# Patient Record
Sex: Female | Born: 1990 | Race: White | Hispanic: Yes | Marital: Single | Smoking: Never smoker
Health system: Southern US, Community
[De-identification: ages and names within clinical notes are randomized; demographics above are authoritative.]

## PROBLEM LIST (undated history)

## (undated) DIAGNOSIS — T7840XA Allergy, unspecified, initial encounter: Secondary | ICD-10-CM

## (undated) DIAGNOSIS — F419 Anxiety disorder, unspecified: Secondary | ICD-10-CM

## (undated) DIAGNOSIS — G43909 Migraine, unspecified, not intractable, without status migrainosus: Secondary | ICD-10-CM

## (undated) DIAGNOSIS — F319 Bipolar disorder, unspecified: Secondary | ICD-10-CM

## (undated) DIAGNOSIS — F32A Depression, unspecified: Secondary | ICD-10-CM

## (undated) DIAGNOSIS — F329 Major depressive disorder, single episode, unspecified: Secondary | ICD-10-CM

## (undated) HISTORY — DX: Major depressive disorder, single episode, unspecified: F32.9

## (undated) HISTORY — DX: Migraine, unspecified, not intractable, without status migrainosus: G43.909

## (undated) HISTORY — DX: Bipolar disorder, unspecified: F31.9

## (undated) HISTORY — DX: Allergy, unspecified, initial encounter: T78.40XA

## (undated) HISTORY — DX: Anxiety disorder, unspecified: F41.9

## (undated) HISTORY — DX: Depression, unspecified: F32.A

---
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
## 2012-06-10 ENCOUNTER — Other Ambulatory Visit: Payer: Self-pay | Admitting: Physician Assistant

## 2012-06-11 ENCOUNTER — Other Ambulatory Visit: Payer: Self-pay | Admitting: Physician Assistant

## 2012-06-14 NOTE — Telephone Encounter (Signed)
Was just refilled???  Sent again to pharmacy

## 2012-07-29 ENCOUNTER — Other Ambulatory Visit: Payer: Self-pay | Admitting: Physician Assistant

## 2012-07-29 NOTE — Telephone Encounter (Signed)
Medication refilled per protocol. 

## 2012-09-20 ENCOUNTER — Encounter: Payer: Self-pay | Admitting: Physician Assistant

## 2012-09-20 ENCOUNTER — Ambulatory Visit (INDEPENDENT_AMBULATORY_CARE_PROVIDER_SITE_OTHER): Payer: BC Managed Care – PPO | Admitting: Physician Assistant

## 2012-09-20 VITALS — BP 110/70 | HR 72 | Temp 98.1°F | Resp 18 | Ht 62.5 in | Wt 121.0 lb

## 2012-09-20 DIAGNOSIS — F411 Generalized anxiety disorder: Secondary | ICD-10-CM

## 2012-09-20 DIAGNOSIS — F329 Major depressive disorder, single episode, unspecified: Secondary | ICD-10-CM

## 2012-09-20 DIAGNOSIS — F419 Anxiety disorder, unspecified: Secondary | ICD-10-CM

## 2012-09-20 DIAGNOSIS — T7840XA Allergy, unspecified, initial encounter: Secondary | ICD-10-CM | POA: Insufficient documentation

## 2012-09-20 DIAGNOSIS — F319 Bipolar disorder, unspecified: Secondary | ICD-10-CM

## 2012-09-20 DIAGNOSIS — M542 Cervicalgia: Secondary | ICD-10-CM

## 2012-09-20 DIAGNOSIS — G43909 Migraine, unspecified, not intractable, without status migrainosus: Secondary | ICD-10-CM

## 2012-09-20 DIAGNOSIS — T7840XS Allergy, unspecified, sequela: Secondary | ICD-10-CM

## 2012-09-20 DIAGNOSIS — F32A Depression, unspecified: Secondary | ICD-10-CM

## 2012-09-21 NOTE — Progress Notes (Signed)
Patient ID: Shadaya Marschner MRN: [id number], DOB: 1990-12-29, 22 y.o. Date of Encounter: @DATE @  Chief Complaint:  Chief Complaint  Patient presents with  . neck pain    few years  getting worse alot of popping and also having more headaches    HPI: 22 y.o. year old white female  presents with her mom for OV today.   She sees Psychiatry -dx with Bipolar- Manic/Depressive, and Anxiety- per pt and mom. She is on Pristiq, and has been on this for 3 years. This is the only psych med she is on. Says her mood has been stable and well controlled with this med.   She also has h/o multiple allergies.  These are her only PMH other than the 2 issues today:  1- Migraines: She has gone to U/C secondary to Migraines 7 times over past 8 months. Says the symptoms are very severe- has pain behind eyes, photophobia, nausea, and vomiting. Has been prescribed Zofran and Imitrex Nasal Spray-has to use the nasal spray b/c she vomits so much cannot use oral med. This does work for her.  However, mom concerned and wants to know why these are occurring. There is no family h/o migraines. As wel,, pt sees no pattern--migraines do not seem to occur with specific stress, menses, or certain foods. No known triggers. She has frequent migraines that are abated with meds --and has had to go to u/c 7 times as well.    2- Pain in back of neck, over the spine area. Mom says that the area sometimes appears swollen. Pt reports popping sound and a sensation that "something is pressing there." at times.    Past Medical History  Diagnosis Date  . Bipolar disorder   . Anxiety   . Depression   . Allergy      Home Meds: See attached medication section for current medication list. Any medications entered into computer today will not appear on this note's list. The medications listed below were entered prior to today. Current Outpatient Prescriptions on File Prior to Visit  Medication Sig Dispense Refill  . EPIPEN 2-PAK 0.3  MG/0.3ML SOAJ USE AS DIRECTED AS NEEDED FOR ALLERGIC REACTION.  MUST BE SEEN AT MEDICAL FACILITY AFTER USING.  1 Device  prn  . levocetirizine (XYZAL) 5 MG tablet TAKE ONE TABLET BY MOUTH DAILY  30 tablet  2   No current facility-administered medications on file prior to visit.    Allergies:  Allergies  Allergen Reactions  . Keflex [Cephalexin] Hives    History   Social History  . Marital Status: Single    Spouse Name: N/A    Number of Children: N/A  . Years of Education: N/A   Occupational History  . Not on file.   Social History Main Topics  . Smoking status: Never Smoker   . Smokeless tobacco: Never Used  . Alcohol Use: No  . Drug Use: No  . Sexual Activity: Not on file   Other Topics Concern  . Not on file   Social History Narrative  . No narrative on file    No family history on file.   Review of Systems:  See HPI for pertinent ROS. All other ROS negative.    Physical Exam: Blood pressure 110/70, pulse 72, temperature 98.1 F (36.7 C), temperature source Oral, resp. rate 18, height 5' 2.5" (1.588 m), weight 121 lb (54.885 kg), last menstrual period 09/06/2012., Body mass index is 21.76 kg/(m^2). General:WNWD WF.  Appears in no acute  distress. Neck: Supple. No thyromegaly. No lymphadenopathy.There is no pain with palpation of sides of neck, and posterior aspect of muculture. She points to spinal area as area of discomfort. There is no mass or abnormality oninspection of plapation. She has full ROM with turning head to each side and tilt ear to shoulder. Lungs: Clear bilaterally to auscultation without wheezes, rales, or rhonchi. Breathing is unlabored. Heart: RRR with S1 S2. No murmurs, rubs, or gallops. Musculoskeletal:  Strength and tone normal for age. Extremities/Skin: Warm and dry. No clubbing or cyanosis. No edema. No rashes or suspicious lesions. Neuro: Alert and oriented X 3. Moves all extremities spontaneously. Gait is normal. CNII-XII grossly in  tact.Pupils equal, react to light normally. Psych:  Responds to questions appropriately with a normal affect.     ASSESSMENT AND PLAN:  22 y.o. year old female with  1. Migraine Will obtain MRI. If this is negative, will add Topamax as preventive therapy and cont Imitrex as abortive therapy. Cont Zofran prn.  - MR Brain W Wo Contrast; Future  2. Neck pain Will obtain XRay to R/O pathology.  - DG Cervical Spine Complete; Future  3. Allergies to multiple foods, grasses, hay  4. Anxiety                           Per Psych 5. Bipolar disorder 6. Depression   Signed, Shon Hale [place], [place], [facility] 09/21/2012 7:31 AM

## 2012-09-24 ENCOUNTER — Ambulatory Visit
Admission: RE | Admit: 2012-09-24 | Discharge: 2012-09-24 | Disposition: A | Discharge status: Home / Self Care | Payer: Self-pay | Source: Ambulatory Visit | Attending: Physician Assistant | Admitting: Physician Assistant

## 2012-09-24 ENCOUNTER — Ambulatory Visit
Admission: RE | Admit: 2012-09-24 | Discharge: 2012-09-24 | Disposition: A | Discharge status: Home / Self Care | Payer: BC Managed Care – PPO | Source: Ambulatory Visit | Attending: Physician Assistant | Admitting: Physician Assistant

## 2012-09-24 DIAGNOSIS — G43909 Migraine, unspecified, not intractable, without status migrainosus: Secondary | ICD-10-CM

## 2012-09-24 DIAGNOSIS — M542 Cervicalgia: Secondary | ICD-10-CM

## 2012-09-24 MED ORDER — GADOBENATE DIMEGLUMINE 529 MG/ML IV SOLN
11.0000 mL | Freq: Once | INTRAVENOUS | Status: AC | PRN
  Administered 2012-09-24: 11 mL via INTRAVENOUS

## 2012-09-27 ENCOUNTER — Ambulatory Visit (INDEPENDENT_AMBULATORY_CARE_PROVIDER_SITE_OTHER): Payer: BC Managed Care – PPO | Admitting: Physician Assistant

## 2012-09-27 ENCOUNTER — Encounter: Payer: Self-pay | Admitting: Physician Assistant

## 2012-09-27 VITALS — BP 98/58 | HR 68 | Temp 98.0°F | Resp 18 | Wt 123.0 lb

## 2012-09-27 DIAGNOSIS — G43909 Migraine, unspecified, not intractable, without status migrainosus: Secondary | ICD-10-CM

## 2012-09-27 DIAGNOSIS — M542 Cervicalgia: Secondary | ICD-10-CM

## 2012-09-27 MED ORDER — TOPIRAMATE 25 MG PO CPSP: ORAL_CAPSULE | ORAL | Status: DC

## 2012-09-27 NOTE — Progress Notes (Signed)
Patient ID: Katrina Thompson MRN: [id number], DOB: 1990/03/30, 22 y.o. Date of Encounter: 09/27/2012, 2:31 PM    Chief Complaint:  Chief Complaint  Patient presents with  . headaches    follow up after imaging     HPI: 22 y.o. year old white female here for followup office visit. She was seen with her mom for an office visit with myself on 09/20/2012.  At that time she was seen for followup regarding popping and clicking sounds in her neck. She has had this for years and it seemed to be getting worse and he wanted to be evaluated. She never has any pain numbness or tingling down her arm.  As well when she was seen on 09/20/2012 she reported having migraines. She had no migraines until this past winter. However over the last 8 months she had gone to urgent care  7 times secondary to severe migraines. She and her mom reported that when she has these migraines the pain is behind her eyes. She also has significant photophobia and nausea and vomiting. She has been prescribed Zofran and Imitrex nasal spray. She has to use the nasal spray because she vomited so much she cannot use oral medication. The Imitrex nasal spray has been working well for her. However the mom is concerned wanted to notify these migraines were occurring. There is no family history of migraines. As well the patient was seen in a pattern. The migraines do not seem to occur with specific stress or other triggers. They do not occur with menses for PMS and hormone changes. They do not seem to occur with certain foods. No known triggers. She says that she has migraines even more frequently that are abated with medicines. However she's had to go to the urgent care 7 times in addition secondary to migraines that would not resolve with her with her prescription medication.  Home Meds: See attached medication section for any medications that were entered at today's visit. The computer does not put those onto this list.The following list is a  list of meds entered prior to today's visit.   Current Outpatient Prescriptions on File Prior to Visit  Medication Sig Dispense Refill  . EPIPEN 2-PAK 0.3 MG/0.3ML SOAJ USE AS DIRECTED AS NEEDED FOR ALLERGIC REACTION.  MUST BE SEEN AT MEDICAL FACILITY AFTER USING.  1 Device  prn  . GILDESS FE 1.5/30 1.5-30 MG-MCG tablet Take 1 tablet by mouth daily.      Marland Kitchen levocetirizine (XYZAL) 5 MG tablet TAKE ONE TABLET BY MOUTH DAILY  30 tablet  2  . ondansetron (ZOFRAN-ODT) 8 MG disintegrating tablet Take 1 tablet by mouth as needed.      Marland Kitchen PRISTIQ 100 MG 24 hr tablet Take 1 tablet by mouth daily.      . SUMAtriptan (IMITREX) 5 MG/ACT nasal spray Place 1 spray into the nose as needed.       No current facility-administered medications on file prior to visit.    Allergies:  Allergies  Allergen Reactions  . Keflex [Cephalexin] Hives      Review of Systems: See HPI for pertinent ROS. All other ROS negative.    Physical Exam: Blood pressure 98/58, pulse 68, temperature 98 F (36.7 C), temperature source Oral, resp. rate 18, weight 123 lb (55.792 kg), last menstrual period 09/06/2012., Body mass index is 22.12 kg/(m^2). General: WNWD white female  Appears in no acute distress. HEENT: Normocephalic, atraumatic, eyes without discharge, sclera non-icteric, nares are without discharge. Bilateral auditory canals  clear, TM's are without perforation, pearly grey and translucent with reflective cone of light bilaterally. Oral cavity moist, posterior pharynx without exudate, erythema, peritonsillar abscess. Eyes: Normal exam. Normal pupillary change with light.   Neck: Supple. No thyromegaly. No lymphadenopathy. Lungs: Clear bilaterally to auscultation without wheezes, rales, or rhonchi. Breathing is unlabored. Heart: Regular rhythm. No murmurs, rubs, or gallops. Msk:  Strength and tone normal for age. Extremities/Skin: Warm and dry. . No edema.  Neuro: Alert and oriented X 3. Moves all extremities  spontaneously. Gait is normal. CNII-XII grossly in tact. Psych:  Responds to questions appropriately with a normal affect.     ASSESSMENT AND PLAN:  22 y.o. year old female with  1. Migraine  At last office visit 09/20/2012 I scheduled her for a brain MRI. This was performed and was normal.  While at a preventative medication. - topiramate (TOPAMAX) 25 MG capsule; Take 1 each night for 1 week then Take 1 twice a day for 1 week then Take 1 in the morning, 2 at night for 1 week then Take 2 in the morning, 2 at night.   Dispense: 90 capsule; Refill: 0  She will schedule followup office visit with me in 4-5 weeks from now. At that time she'll be on the Topamax 50 mg twice a day. We followup at that time to see how this Topamax is working for her. She will call me sooner if she is having adverse effects. I discussed on the common adverse effects on include peripheral numbness and cognitive slowing.  She will continue Imitrex nasal spray as abortive therapy.  2. Neck pain At her office visit 09/20/2012 ordered cervical spine x-ray. This was performed and was completely normal. Reassured her that the popping and clicking are benign.   Murray Hodgkins [place], [place], [facility] 09/27/2012 2:31 PM

## 2012-09-29 ENCOUNTER — Ambulatory Visit: Payer: BC Managed Care – PPO | Admitting: Physician Assistant

## 2012-11-01 ENCOUNTER — Ambulatory Visit: Payer: BC Managed Care – PPO | Admitting: Physician Assistant

## 2013-05-09 ENCOUNTER — Ambulatory Visit: Payer: BC Managed Care – PPO | Admitting: Physician Assistant

## 2014-04-24 ENCOUNTER — Other Ambulatory Visit: Payer: Self-pay | Admitting: Physician Assistant

## 2014-04-24 MED ORDER — LEVOCETIRIZINE DIHYDROCHLORIDE 5 MG PO TABS: 5.0000 mg | ORAL_TABLET | Freq: Every day | ORAL | Status: DC

## 2014-04-24 MED ORDER — EPINEPHRINE 0.3 MG/0.3ML IJ SOAJ: INTRAMUSCULAR | Status: DC

## 2020-06-25 ENCOUNTER — Encounter (HOSPITAL_BASED_OUTPATIENT_CLINIC_OR_DEPARTMENT_OTHER): Payer: Self-pay | Admitting: Nurse Practitioner

## 2020-06-25 ENCOUNTER — Encounter (HOSPITAL_BASED_OUTPATIENT_CLINIC_OR_DEPARTMENT_OTHER): Payer: Self-pay

## 2020-06-25 ENCOUNTER — Other Ambulatory Visit: Payer: Self-pay

## 2020-06-25 ENCOUNTER — Ambulatory Visit (INDEPENDENT_AMBULATORY_CARE_PROVIDER_SITE_OTHER): Payer: Self-pay | Admitting: Nurse Practitioner

## 2020-06-25 VITALS — BP 98/53 | HR 67 | Ht 63.0 in | Wt 114.0 lb

## 2020-06-25 DIAGNOSIS — Z7689 Persons encountering health services in other specified circumstances: Secondary | ICD-10-CM

## 2020-06-25 DIAGNOSIS — Z3046 Encounter for surveillance of implantable subdermal contraceptive: Secondary | ICD-10-CM | POA: Insufficient documentation

## 2020-06-25 DIAGNOSIS — F419 Anxiety disorder, unspecified: Secondary | ICD-10-CM

## 2020-06-25 MED ORDER — SERTRALINE HCL 100 MG PO TABS: 100.0000 mg | ORAL_TABLET | Freq: Every day | ORAL | 11 refills | Status: DC

## 2020-06-25 NOTE — Progress Notes (Signed)
Katrina Clamp, DNP, AGNP-c Primary Care Services __________________________________________________________________________________________  HPI Katrina Thompson is a 30 y.o. female presenting to [facility] at [facility] today to establish care.   Patient Care Team: Dameion Briles, Sung Amabile, NP as PCP - General (Nurse Practitioner) Last Visit [phone] Last CPE: 2018/2019 Last Pap: 2015 Other providers seen: Urgent Care in [facility]  Concerns today: . Establish Care . Refills sertraline  She has no known health history other than anxiety/depression On sertraline since fall of last year when separated from husband Moved home to [place] about 2 months ago due to separation to be closer to family Has started new job- feels safe- feels supported with family and friends. Does feel like medication may need to be increased d/t anxiety and depression breakthrough symptoms New insurance has not started yet  Will need Tetanus, and Pap Smear in near future   Narrative: Katrina Thompson is MARITAL STATUS: Single She does not have a history or partner abuse.  She is currently currently employed.  She endorses normal activities of daily living. She denies nicotine use, denies  Drug use, denies alcohol use. She is not currently sexually active.  She reports regular menses She is not planning pregnancy in the near future.  Contraceptive options include abstinence She reports STI history of STDs: none and denies concerns for STI today. She reports recent mood related changes. PHQ and GAD listed below.  PHQ9 Today: Depression screen PHQ 2/9 06/25/2020  Decreased Interest 2  Down, Depressed, Hopeless 1  PHQ - 2 Score 3  Altered sleeping 2  Tired, decreased energy 0  Change in appetite 0  Feeling bad or failure about yourself  1  Trouble concentrating 0  Moving slowly or fidgety/restless 0  Suicidal thoughts 0  PHQ-9 Score 6  Difficult doing work/chores  Not difficult at all   GAD7 Today: GAD 7 : Generalized Anxiety Score 06/25/2020  Nervous, Anxious, on Edge 2  Control/stop worrying 2  Worry too much - different things 2  Trouble relaxing 2  Restless 0  Easily annoyed or irritable 1  Afraid - awful might happen 0  Total GAD 7 Score 9  Anxiety Difficulty Not difficult at all    Health Maintenance Due  Topic Date Due  . COVID-19 Vaccine (1) Never done  . HIV Screening  Never done  . Hepatitis C Screening  Never done  . TETANUS/TDAP  Never done  . PAP-Cervical Cytology Screening  Never done  . PAP SMEAR-Modifier  Never done     PMH Past Medical History:  Diagnosis Date  . Allergy   . Anxiety   . Bipolar disorder (HCC)   . Depression   . Migraine     ROS All review of systems negative except what is listed in the HPI  PHYSICAL EXAM General Appearance:  awake, alert, oriented, in no acute distress, well developed, well nourished and in no acute distress Skin:  skin color, texture, turgor are normal; there are no bruises, rashes or lesions. Head/face:  NCAT Eyes:  No gross abnormalities., PERRL and EOMI Ears:  canals and TMs NI Nose/Sinuses:  Nares normal. Septum midline. Mucosa normal. No drainage or sinus tenderness. Mouth/Throat:  Mucosa moist, no lesions; pharynx without erythema, edema or exudate. Neck:  neck- supple, no mass, non-tender, no bruits and no jvd Back:  no pain to palpation, good flexion and extension, motor and sensory appear to be normal Lungs:  Normal expansion.  Clear to auscultation.  No  rales, rhonchi, or wheezing. Heart:  Heart sounds are normal.  Regular rate and rhythm without murmur, gallop or rub. Abdomen:  Soft, non-tender, normal bowel sounds; no bruits, organomegaly or masses. Peripheral Pulses:  Capillary refill <2secs, strong peripheral pulses Neurologic:  Alert and oriented x 3, gait normal., reflexes normal and symmetric, strength and  sensation grossly normal Psych  exam:alert,oriented, in NAD with a full range of affect, normal behavior and no psychotic features  ASSESSMENT AND PLAN Problem List Items Addressed This Visit    Anxiety    Hx of anxiety- treated with sertraline 50mg  Reports symptoms fairly well controlled, but feels that she could use increased dose. Will plan to increase dose to 75mg  for remainder of current prescription (1.5 tabs) then increase to 100mg  Script sent to pharmacy F/U with in a few weeks for evaluation       Relevant Medications   sertraline (ZOLOFT) 100 MG tablet   Encounter to establish care - Primary    Review of current and past medical history, social history, medication, and family history.  Review of care gaps and health maintenance recommendations.  No recent providers, other than Urgent Care Recommendations for health maintenance, diet, and exercise provided.  Will plan for pap, labs, and tdap booster in a few weeks with f/u for mood           Education provided today during visit and on AVS for patient to review at home.  Diet and Exercise recommendations provided.  Current diagnoses and recommendations discussed. HM recommendations reviewed with recommendations.    Outpatient Encounter Medications as of 06/25/2020  Medication Sig  . sertraline (ZOLOFT) 100 MG tablet Take 1 tablet (100 mg total) by mouth daily.  . [DISCONTINUED] EPINEPHrine (EPIPEN 2-PAK) 0.3 mg/0.3 mL IJ SOAJ injection USE AS DIRECTED AS NEEDED FOR ALLERGIC REACTION.  MUST BE SEEN AT MEDICAL FACILITY AFTER USING.  . [DISCONTINUED] GILDESS FE 1.5/30 1.5-30 MG-MCG tablet Take 1 tablet by mouth daily.  . [DISCONTINUED] levocetirizine (XYZAL) 5 MG tablet Take 1 tablet (5 mg total) by mouth daily.  . [DISCONTINUED] ondansetron (ZOFRAN-ODT) 8 MG disintegrating tablet Take 1 tablet by mouth as needed.  . [DISCONTINUED] PRISTIQ 100 MG 24 hr tablet Take 1 tablet by mouth daily.  . [DISCONTINUED] SUMAtriptan (IMITREX) 5 MG/ACT nasal spray  Place 1 spray into the nose as needed.  . [DISCONTINUED] topiramate (TOPAMAX) 25 MG capsule Take 1 each night for 1 week then Take 1 twice a day for 1 week then Take 1 in the morning, 2 at night for 1 week then Take 2 in the morning, 2 at night.   No facility-administered encounter medications on file as of 06/25/2020.    Return for Pap, Labs, and Mood F/u in few weeks when insurance is available.  Time: 20 minutes, >50% spent counseling, care coordination, chart review, and documentation.   06-06-1982, DNP, AGNP-c

## 2020-06-25 NOTE — Assessment & Plan Note (Signed)
Review of current and past medical history, social history, medication, and family history.  Review of care gaps and health maintenance recommendations.  No recent providers, other than Urgent Care Recommendations for health maintenance, diet, and exercise provided.  Will plan for pap, labs, and tdap booster in a few weeks with f/u for mood

## 2020-06-25 NOTE — Assessment & Plan Note (Signed)
Hx of anxiety- treated with sertraline 50mg  Reports symptoms fairly well controlled, but feels that she could use increased dose. Will plan to increase dose to 75mg  for remainder of current prescription (1.5 tabs) then increase to 100mg  Script sent to pharmacy F/U with in a few weeks for evaluation

## 2020-06-25 NOTE — Patient Instructions (Addendum)
Recommendations from today's visit: . We will plan to schedule your pap smear and labs in a few weeks once your insurance kicks in . Let me know if the increased dose of sertraline feels like too much and we can always bump it back down . If you need anything, please feel free to reach out.   Information on diet, exercise, and health maintenance recommendations are listed below. This is information to help you be sure you are on track for optimal health and monitoring.   Please look over this and let us know if you have any questions or if you have completed any of the health maintenance outside of [facility] so that we can be sure your records are up to date.  ___________________________________________________________  Thank you for choosing [facility] for your Primary Care needs. I am excited for the opportunity to partner with you to meet your health care goals. It was a pleasure meeting you today!  I am an Adult-Geriatric Nurse Practitioner with a background in caring for patients for more than 20 years. I received my Paediatric nurse in Nursing and my Doctor of Nursing Practice degrees at [employer]. I received additional fellowship training in primary care and sports medicine after receiving my doctorate degree. I provide primary care and sports medicine services to patients age 9 and older within this office. I am also a provider with the [facility] and the director of the APP Fellowship with [facility].  I am a [place] native, but have called the [place] area home for nearly 20 years and am proud to be a member of this community.   I am passionate about providing the best service to you through preventive medicine and supportive care. I consider you a part of the medical team and value your input. I work diligently to ensure that you are heard and your needs are met in a safe and effective manner. I want  you to feel comfortable with me as your provider and want you to know that your health concerns are important to me.   For your information, our office hours are Monday- Friday 8:00 AM - 5:00 PM At this time I am not in the office on Wednesdays.  If you have questions or concerns, please call our office at [phone] or send [place] a MyChart message and we will respond as quickly as possible.   For all urgent or time sensitive needs we ask that you please call the office to avoid delays. MyChart is not constantly monitored and replies may take up to 72 business hours.  MyChart Policy: . MyChart allows for you to see your visit notes, after visit summary, provider recommendations, lab and tests results, make an appointment, request refills, and contact your provider or the office for non-urgent questions or concerns.  . Providers are seeing patients during normal business hours and do not have built in time to review MyChart messages. We ask that you allow a minimum of 72 business hours for MyChart message responses.  . Complex MyChart concerns may require a visit. Your provider may request you schedule a virtual or in person visit to ensure we are providing the best care possible. . MyChart messages sent after 4:00 PM on Friday will not be received by the provider until Monday morning.    Lab and Test Results: . You will receive your lab and test results on MyChart as soon as they  are completed and results have been sent by the lab or testing facility. Due to this service, you will receive your results BEFORE your provider.  . Please allow a minimum of 72 business hours for your provider to receive and review lab and test results and contact you about.   . Most lab and test result comments from the provider will be sent through [place]. Your provider may recommend changes to the plan of care, follow-up visits, repeat testing, ask questions, or request an office visit to discuss these results. You  may reply directly to this message or call the office at [phone] to provide information for the provider or set up an appointment. . In some instances, you will be called with test results and recommendations. Please let us know if this is preferred and we will make note of this in your chart to provide this for you.    . If you have not heard a response to your lab or test results in 72 business hours, please call the office to let us know.   After Hours: . For all non-emergency after hours needs, please call the office at [phone] and select the option to reach the on-call provider service. On-call services are shared between multiple [facility] [place] offices and therefore it will not be possible to speak directly with your provider. On-call providers may provide medical advice and recommendations, but are unable to provide refills for maintenance medications.  . For all emergency or urgent medical needs after normal business hours, we recommend that you seek care at the closest Urgent Care or Emergency Department to ensure appropriate treatment in a timely manner.  [facility] has a 24 hour emergency room located on the ground floor for your convenience.    Please do not hesitate to reach out to [place] with concerns.   Thank you, again, for choosing me as your health care partner. I appreciate your trust and look forward to learning more about you.   Worthy Keeler, DNP, AGNP-c ___________________________________________________________  Health Maintenance Recommendations Screening Testing  Mammogram  Every 1 -2 years based on history and risk factors  Starting at age 38  Pap Smear  Ages 21-39 every 3 years  Ages 1-65 every 5 years with HPV testing  More frequent testing may be required based on results and history  Colon Cancer Screening  Every 1-10 years based on test performed, risk factors, and history  Starting at age 49  Bone Density  Screening  Every 2-10 years based on history  Starting at age 20 for women  Recommendations for men differ based on medication usage, history, and risk factors  AAA Screening  One time ultrasound  Men 19-53 years old who have every smoked  Lung Cancer Screening  Low Dose Lung CT every 12 months  Age 25-80 years with a 30 pack-year smoking history who still smoke or who have quit within the last 15 years  Screening Labs  Routine  Labs: Complete Blood Count (CBC), Complete Metabolic Panel (CMP), Cholesterol (Lipid Panel)  Every 6-12 months based on history and medications  May be recommended more frequently based on current conditions or previous results  Hemoglobin A1c Lab  Every 3-12 months based on history and previous results  Starting at age 11 or earlier with diagnosis of diabetes, high cholesterol, BMI >26, and/or risk factors  Frequent monitoring for patients with diabetes to ensure blood sugar control  Thyroid Panel (TSH w/ T3 & T4)  Every  6 months based on history, symptoms, and risk factors  May be repeated more often if on medication  HIV  One time testing for all patients 27 and older  May be repeated more frequently for patients with increased risk factors or exposure  Hepatitis C  One time testing for all patients 4 and older  May be repeated more frequently for patients with increased risk factors or exposure  Gonorrhea, Chlamydia  Every 12 months for all sexually active persons 13-24 years  Additional monitoring may be recommended for those who are considered high risk or who have symptoms  PSA  Men 53-27 years old with risk factors  Additional screening may be recommended from age 24-69 based on risk factors, symptoms, and history  Vaccine Recommendations  Tetanus Booster  All adults every 10 years  Flu Vaccine  All patients 6 months and older every year  COVID Vaccine  All patients 12 years and older  Initial dosing  with booster  May recommend additional booster based on age and health history  HPV Vaccine  2 doses all patients age 15-26  Dosing may be considered for patients over 26  Shingles Vaccine (Shingrix)  2 doses all adults 80 years and older  Pneumonia (Pneumovax 23)  All adults 39 years and older  May recommend earlier dosing based on health history  Pneumonia (Prevnar 56)  All adults 19 years and older  Dosed 1 year after Pneumovax 23  Additional Screening, Testing, and Vaccinations may be recommended on an individualized basis based on family history, health history, risk factors, and/or exposure.  __________________________________________________________  Diet Recommendations for All Patients  I recommend that all patients maintain a diet low in saturated fats, carbohydrates, and cholesterol. While this can be challenging at first, it is not impossible and small changes can make big differences.  Things to try: Marland Kitchen Decreasing the amount of soda, sweet tea, and/or juice to one or less per day and replace with water o While water is always the first choice, if you do not like water you may consider - adding a water additive without sugar to improve the taste - other sugar free drinks . Replace potatoes with a brightly colored vegetable at dinner . Use healthy oils, such as canola oil or olive oil, instead of butter or hard margarine . Limit your bread intake to two pieces or less a day . Replace regular pasta with low carb pasta options . Bake, broil, or grill foods instead of frying . Monitor portion sizes  . Eat smaller, more frequent meals throughout the day instead of large meals  An important thing to remember is, if you love foods that are not great for your health, you don't have to give them up completely. Instead, allow these foods to be a reward when you have done well. Allowing yourself to still have special treats every once in a while is a nice way to tell  yourself thank you for working hard to keep yourself healthy.   Also remember that every day is a new day. If you have a bad day and "fall off the wagon", you can still climb right back up and keep moving along on your journey!  We have resources available to help you!  Some websites that may be helpful include: . www.http://carter.biz/  . Www.VeryWellFit.com _____________________________________________________________  Activity Recommendations for All Patients  I recommend that all adults get at least 20 minutes of moderate physical activity that elevates your heart rate at least  5 days out of the week.  Some examples include: . Walking or jogging at a pace that allows you to carry on a conversation . Cycling (stationary bike or outdoors) . Water aerobics . Yoga . Weight lifting . Dancing If physical limitations prevent you from putting stress on your joints, exercise in a pool or seated in a chair are excellent options.  Do determine your MAXIMUM heart rate for activity: YOUR AGE - 220 = MAX HeartRate   Remember! . Do not push yourself too hard.  . Start slowly and build up your pace, speed, weight, time in exercise, etc.  . Allow your body to rest between exercise and get good sleep. . You will need more water than normal when you are exerting yourself. Do not wait until you are thirsty to drink. Drink with a purpose of getting in at least 8, 8 ounce glasses of water a day plus more depending on how much you exercise and sweat.    If you begin to develop dizziness, chest pain, abdominal pain, jaw pain, shortness of breath, headache, vision changes, lightheadedness, or other concerning symptoms, stop the activity and allow your body to rest. If your symptoms are severe, seek emergency evaluation immediately. If your symptoms are concerning, but not severe, please let us know so that we can recommend further evaluation.    ________________________________________________________________

## 2020-07-11 NOTE — Progress Notes (Signed)
Err

## 2020-08-10 DIAGNOSIS — D225 Melanocytic nevi of trunk: Secondary | ICD-10-CM | POA: Diagnosis not present

## 2020-09-24 ENCOUNTER — Other Ambulatory Visit: Payer: Self-pay | Admitting: Dentistry

## 2020-09-24 DIAGNOSIS — R519 Headache, unspecified: Secondary | ICD-10-CM

## 2020-09-24 DIAGNOSIS — M26631 Articular disc disorder of right temporomandibular joint: Secondary | ICD-10-CM

## 2020-10-07 ENCOUNTER — Ambulatory Visit
Admission: RE | Admit: 2020-10-07 | Discharge: 2020-10-07 | Disposition: A | Discharge status: Home / Self Care | Payer: BLUE CROSS/BLUE SHIELD | Source: Ambulatory Visit | Attending: Dentistry | Admitting: Dentistry

## 2020-10-07 ENCOUNTER — Other Ambulatory Visit: Payer: Self-pay

## 2020-10-07 DIAGNOSIS — R519 Headache, unspecified: Secondary | ICD-10-CM

## 2020-10-07 DIAGNOSIS — S0301XA Dislocation of jaw, right side, initial encounter: Secondary | ICD-10-CM | POA: Diagnosis not present

## 2020-10-07 DIAGNOSIS — M26631 Articular disc disorder of right temporomandibular joint: Secondary | ICD-10-CM

## 2020-10-07 DIAGNOSIS — M26601 Right temporomandibular joint disorder, unspecified: Secondary | ICD-10-CM | POA: Diagnosis not present

## 2020-11-19 ENCOUNTER — Ambulatory Visit (HOSPITAL_BASED_OUTPATIENT_CLINIC_OR_DEPARTMENT_OTHER): Payer: BLUE CROSS/BLUE SHIELD | Admitting: Nurse Practitioner

## 2020-11-19 ENCOUNTER — Encounter (HOSPITAL_BASED_OUTPATIENT_CLINIC_OR_DEPARTMENT_OTHER): Payer: Self-pay | Admitting: Nurse Practitioner

## 2020-11-19 ENCOUNTER — Other Ambulatory Visit: Payer: Self-pay

## 2020-11-19 VITALS — BP 105/55 | HR 92 | Ht 63.0 in | Wt 115.0 lb

## 2020-11-19 DIAGNOSIS — M26653 Arthropathy of bilateral temporomandibular joint: Secondary | ICD-10-CM

## 2020-11-19 DIAGNOSIS — M26639 Articular disc disorder of temporomandibular joint, unspecified side: Secondary | ICD-10-CM

## 2020-11-19 DIAGNOSIS — S0300XA Dislocation of jaw, unspecified side, initial encounter: Secondary | ICD-10-CM

## 2020-11-19 DIAGNOSIS — R6884 Jaw pain: Secondary | ICD-10-CM | POA: Insufficient documentation

## 2020-11-19 NOTE — Assessment & Plan Note (Signed)
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 MRI reviewed today Unable to review dentist notes Treatment recommendations appear limited to pain management and not patient request for reduction in worsening damage and correction. Explained to patient that a second opinion would not be a bad idea if her ultimate goal does not align with the initial provider. Patient understands the limitations in primary care for management and she appears to have exhausted most conservative options.  Will send referral to oral surgeon for further evaluation and recommendations for second opinion.

## 2020-11-19 NOTE — Patient Instructions (Addendum)
I am going to send the referral to the oral surgeon to see if we can get further evaluation and recommendations for your jaw pain.   I think having the MRI results will be helpful for this. At least you will know if this is the recommended treatment.

## 2020-11-19 NOTE — Assessment & Plan Note (Signed)
MRI results reviewed today Given the treatments attempted and interruption with daily life patient is experiencing, I do feel a second opinion is worthwhile. There is evidence of degenerative changes that could progress as she ages.  Unclear if injections and ultrasound therapy would delay or limit this progression, which is her biggest concern.  Referral placed for oral surgery

## 2020-11-19 NOTE — Progress Notes (Signed)
Acute Office Visit  Subjective:    Patient ID: Katrina Thompson, female    DOB: 01-20-1991, 30 y.o.   MRN: [id number]  Chief Complaint  Patient presents with   Jaw Pain    Jaw pain and stiffness.    HPI Patient is in today for jaw pain. She has a long history of jaw pain and stiffness for which she has tried multiple treatment modalities with no resolution. She recently was seen by a dentist with TMJ specialty and an MRI was performed of the jaw for further evaluation.  MRI showed degeneration bilaterally with anterior disc displacement and articular disc buckling of the right when the mouth is opened and anterior disc displacement on the left.   The dentist recommended Sarapin injections and ultrasound therapy for treatment, however, the patient would like a second opinion on this as she feels that this is a bandaid to the underlying problem and would like to find resolution to the issue rather than just temporary pain relief.   She has had the following treatments thus far with no resolution: Wisdom teeth removal Two root canals Gabapentin Ibuprofen daily use NTI splint- helps for a couple of days but then jaw will feel worse upon awakening Monitoring diet closely to avoid hard or chewy foods.   She was previously told her pain was related to trigeminal neuralgia, but this was found to be incorrect.  She tells me her jaw is not locking on her at this time. Painful all of the time. Not experiencing bad headaches at this time. No misalignment of teeth.  Past Medical History:  Diagnosis Date   Allergy    Anxiety    Bipolar disorder (HCC)    Depression    Migraine     Review of Systems All review of systems negative except what is listed in the HPI     Objective:    Physical Exam Vitals and nursing note reviewed.  Constitutional:      General: She is not in acute distress.    Appearance: Normal appearance. She is normal weight.  HENT:     Head:     Jaw: Tenderness,  swelling and pain on movement present. No trismus or malocclusion.     Salivary Glands: Right salivary gland is not diffusely enlarged or tender. Left salivary gland is not diffusely enlarged or tender.     Right Ear: Hearing normal.     Left Ear: Hearing normal.  Eyes:     Extraocular Movements: Extraocular movements intact.     Conjunctiva/sclera: Conjunctivae normal.     Pupils: Pupils are equal, round, and reactive to light.  Cardiovascular:     Rate and Rhythm: Normal rate and regular rhythm.     Pulses: Normal pulses.     Heart sounds: Normal heart sounds.  Pulmonary:     Effort: Pulmonary effort is normal.     Breath sounds: Normal breath sounds.  Neurological:     Mental Status: She is alert.    BP (!) 105/55   Pulse 92   Ht 5\' 3"  (1.6 m)   Wt 115 lb (52.2 kg)   SpO2 99%   BMI 20.37 kg/m  Wt Readings from Last 3 Encounters:  11/19/20 115 lb (52.2 kg)  06/25/20 114 lb (51.7 kg)  09/27/12 123 lb (55.8 kg)      Assessment & Plan:   Problem List Items Addressed This Visit     Jaw pain - Primary   Displacement of disc  of temporomandibular joint    MRI results reviewed today Given the treatments attempted and interruption with daily life patient is experiencing, I do feel a second opinion is worthwhile. There is evidence of degenerative changes that could progress as she ages.  Unclear if injections and ultrasound therapy would delay or limit this progression, which is her biggest concern.  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 MRI reviewed today Unable to review dentist notes Treatment recommendations appear limited to pain management and not patient request for reduction in worsening damage and correction. Explained to patient that a second opinion would not be a bad idea if her ultimate goal does not  align with the initial provider. Patient understands the limitations in primary care for management and she appears to have exhausted most conservative options.  Will send referral to oral surgeon for further evaluation and recommendations for second opinion.       Relevant Orders   Ambulatory referral to Oral Maxillofacial Surgery     No orders of the defined types were placed in this encounter.    Tollie Eth, NP

## 2020-11-23 ENCOUNTER — Encounter (HOSPITAL_BASED_OUTPATIENT_CLINIC_OR_DEPARTMENT_OTHER): Payer: Self-pay | Admitting: Nurse Practitioner

## 2021-01-11 ENCOUNTER — Encounter (HOSPITAL_BASED_OUTPATIENT_CLINIC_OR_DEPARTMENT_OTHER): Payer: Self-pay | Admitting: Nurse Practitioner

## 2021-01-11 ENCOUNTER — Ambulatory Visit (HOSPITAL_BASED_OUTPATIENT_CLINIC_OR_DEPARTMENT_OTHER): Payer: BC Managed Care – PPO | Admitting: Nurse Practitioner

## 2021-01-11 ENCOUNTER — Other Ambulatory Visit: Payer: Self-pay

## 2021-01-11 VITALS — BP 117/72 | HR 75 | Ht 63.0 in | Wt 119.0 lb

## 2021-01-11 DIAGNOSIS — Z3040 Encounter for surveillance of contraceptives, unspecified: Secondary | ICD-10-CM

## 2021-01-11 HISTORY — DX: Encounter for surveillance of contraceptives, unspecified: Z30.40

## 2021-01-11 NOTE — Assessment & Plan Note (Signed)
Discussion and education provided today on various contraceptive methods including injectables, pill, patch, ring, implant, and IUD. Patient is interested in trying the Nexplanon implant for long-term contraceptive management. She is expected to start her menstrual cycle next week. Education on risks of changes to menstrual patterns and risk evaluation completed.  Will plan to have patient return for nexplanon insertion in near future.  Backup contraception recommended for 7 days after insertion.

## 2021-01-11 NOTE — Patient Instructions (Signed)
We will plan to have you come back some time on the week of the 19th so we can place the Nexplanon. If you have any questions between now and then, please do not hesitate to reach out.

## 2021-01-11 NOTE — Progress Notes (Signed)
Established Patient Office Visit  Subjective:  Patient ID: Katrina Thompson, female    DOB: March 22, 1990  Age: 30 y.o. MRN: [id number]  CC:  Chief Complaint  Patient presents with   Contraception    Patient comes in today to discuss birth control, she is interested in low dose arm implant. I did inform here that has to be ordered and we don't have them in the office. I told her Minna Merritts would discuss this with her, she is acceptable with this plan. She is sexual active and using condoms. No refills are needed on today's visit.     HPI Katrina Thompson presents for discussion of contraceptive methods.  She is currently sexually active in a mutually monogamous relationship and is looking for an alternative to condom use. She is interested in long term contraception as opposed to daily, weekly, or monthly options.  She is interested in Nexplanon implant.  She has no history of blood clots, bleeding d/o, and she is a non-smoker. She has normal menstrual cycles with regular bleeding.  She has no history of STI.  She has been off of oral contraception since about 2018. She is expected to start her menstrual cycle in the next week.   Past Medical History:  Diagnosis Date   Allergy    Anxiety    Bipolar disorder (HCC)    Depression    Migraine     History reviewed. No pertinent surgical history.  History reviewed. No pertinent family history.  Social History   Socioeconomic History   Marital status: Single    Spouse name: Not on file   Number of children: Not on file   Years of education: Not on file   Highest education level: Not on file  Occupational History   Not on file  Tobacco Use   Smoking status: Never   Smokeless tobacco: Never  Substance and Sexual Activity   Alcohol use: No   Drug use: No   Sexual activity: Not on file  Other Topics Concern   Not on file  Social History Narrative   Not on file   Social Determinants of Health   Financial Resource Strain: Not on  file  Food Insecurity: Not on file  Transportation Needs: Not on file  Physical Activity: Not on file  Stress: Not on file  Social Connections: Not on file  Intimate Partner Violence: Not on file    Outpatient Medications Prior to Visit  Medication Sig Dispense Refill   sertraline (ZOLOFT) 100 MG tablet Take 1 tablet (100 mg total) by mouth daily. 30 tablet 11   No facility-administered medications prior to visit.    Allergies  Allergen Reactions   Keflex [Cephalexin] Hives    ROS Review of Systems All review of systems negative except what is listed in the HPI    Objective:    Physical Exam Vitals and nursing note reviewed.  Constitutional:      Appearance: Normal appearance. She is normal weight.  Eyes:     Extraocular Movements: Extraocular movements intact.     Conjunctiva/sclera: Conjunctivae normal.     Pupils: Pupils are equal, round, and reactive to light.  Cardiovascular:     Rate and Rhythm: Normal rate.     Pulses: Normal pulses.  Pulmonary:     Effort: Pulmonary effort is normal.  Musculoskeletal:        General: Normal range of motion.     Cervical back: Normal range of motion.  Skin:    General:  Skin is warm and dry.  Neurological:     General: No focal deficit present.     Mental Status: She is alert and oriented to person, place, and time.  Psychiatric:        Mood and Affect: Mood normal.        Behavior: Behavior normal.        Thought Content: Thought content normal.        Judgment: Judgment normal.    BP 117/72   Pulse 75   Ht 5\' 3"  (1.6 m)   Wt 119 lb (54 kg)   SpO2 99%   BMI 21.08 kg/m  Wt Readings from Last 3 Encounters:  01/11/21 119 lb (54 kg)  11/19/20 115 lb (52.2 kg)  06/25/20 114 lb (51.7 kg)     Health Maintenance Due  Topic Date Due   COVID-19 Vaccine (1) Never done   HIV Screening  Never done   Hepatitis C Screening  Never done   TETANUS/TDAP  Never done   PAP-Cervical Cytology Screening  Never done   PAP  SMEAR-Modifier  Never done    There are no preventive care reminders to display for this patient.  No results found for: TSH Lab Results  Component Value Date   WBC 7.8 WHITE COUNT CONFIRMED ON SMEAR 08/27/2009   HGB 15.3 (H) 08/27/2009   HCT 45.0 08/27/2009   MCV 87.1 08/27/2009   PLT PLATELET CLUMPS NOTED ON SMEAR, UNABLE TO ESTIMATE 08/27/2009   Lab Results  Component Value Date   NA 135 08/27/2009   K 3.6 08/27/2009   GLUCOSE 102 (H) 08/27/2009   BUN <3 (L) 08/27/2009   CREATININE 0.8 08/27/2009   No results found for: CHOL No results found for: HDL No results found for: LDLCALC No results found for: TRIG No results found for: CHOLHDL No results found for: 08/29/2009    Assessment & Plan:   Problem List Items Addressed This Visit     Encounter for surveillance of contraceptives - Primary    Discussion and education provided today on various contraceptive methods including injectables, pill, patch, ring, implant, and IUD. Patient is interested in trying the Nexplanon implant for long-term contraceptive management. She is expected to start her menstrual cycle next week. Education on risks of changes to menstrual patterns and risk evaluation completed.  Will plan to have patient return for nexplanon insertion in near future.  Backup contraception recommended for 7 days after insertion.        No orders of the defined types were placed in this encounter.   Follow-up: Return for week of 12/19 for nexplanon insertion- 1/20 procedure visit.    , NP

## 2021-01-23 ENCOUNTER — Encounter (HOSPITAL_BASED_OUTPATIENT_CLINIC_OR_DEPARTMENT_OTHER): Payer: Self-pay | Admitting: Nurse Practitioner

## 2021-01-23 ENCOUNTER — Other Ambulatory Visit: Payer: Self-pay

## 2021-01-23 ENCOUNTER — Ambulatory Visit (HOSPITAL_BASED_OUTPATIENT_CLINIC_OR_DEPARTMENT_OTHER): Payer: BC Managed Care – PPO | Admitting: Nurse Practitioner

## 2021-01-23 VITALS — BP 117/72 | HR 99 | Ht 63.0 in | Wt 118.0 lb

## 2021-01-23 DIAGNOSIS — Z30017 Encounter for initial prescription of implantable subdermal contraceptive: Secondary | ICD-10-CM | POA: Diagnosis not present

## 2021-01-23 NOTE — Patient Instructions (Addendum)
NEXPLANON POSTINSERTION CARE AND FOLLOW-UP  Your Nexplanon will provide contraceptive management to protect against pregnancy for the next 3 years. Removal date recommended 01/24/2024.  Most patients do not experience pain after insertion, but if it occurs Tylenol or Ibuprofen may be used as directed on the packaging to help.  After Care -- Keep the wrap on your arm for at least 30 minutes to help prevent bleeding.  Keep the brown bandage (under the wrap) in place for 24 hours.  The strip under the bandage will fall off naturally in 1-3 days after removing the bandage. If the edges come up, these can be trimmed.   Back-up contraception -- Abstinence or back-up contraception is suggested for the first 7 days after insertion if the implant is inserted.   Symptoms requiring evaluation -- Please notify us if you have any change in their health status such as new medical diagnosis or initiation of new medications.   If you experience new onset of the following symptoms, let us know or seek evaluation immediately: ?Persistent lower leg pain ?Severe chest pain or heaviness ?Sudden shortness of breath, sharp chest pain, or coughing blood ?Symptoms of a severe allergic reaction, such as swollen face, tongue or pharynx; trouble swallowing; or hives and trouble breathing ?Sudden severe headache that is not consistent with usual headaches ?Weakness or numbness in an arm or leg, or difficulty speaking ?Sudden partial or complete blindness ?Yellowing skin or whites of eyes, especially with fever; tiredness; loss of appetite; dark-colored urine; or light-colored bowel movements ?Severe pain, swelling, or tenderness in the lower abdomen ?New breast lump or mass ?Problems sleeping, lack of energy, tiredness, or change in mood ?Heavy menstrual bleeding ?Concern that the implant may have broken or bent

## 2021-01-23 NOTE — Progress Notes (Signed)
Established Patient Office Visit  Subjective:  Patient ID: Katrina Thompson, female    DOB: [birth year]  Age: 30 y.o. MRN: [id number]  CC:  Chief Complaint  Patient presents with   Contraception    Patient presents today for Nexplanon implant.     HPI [name] presents for insertion of nexplanon intradermal contraception implant.  She is currently using condoms for contraceptive management.  She is sexually active in a mutually monogamous relationship.  No concerns for STI.  She has not had unprotected intercourse in the last 10 days.  The first day of her LMP was 12/15-12/20  Allergies  Allergen Reactions   Keflex [Cephalexin] Hives   ROS Review of Systems All review of systems negative except what is listed in the HPI    Objective:    Physical Exam Vitals and nursing note reviewed.  Constitutional:      Appearance: Normal appearance. She is normal weight.  HENT:     Head: Normocephalic.  Eyes:     Extraocular Movements: Extraocular movements intact.     Conjunctiva/sclera: Conjunctivae normal.     Pupils: Pupils are equal, round, and reactive to light.  Cardiovascular:     Rate and Rhythm: Normal rate and regular rhythm.     Pulses: Normal pulses.  Pulmonary:     Effort: Pulmonary effort is normal.  Musculoskeletal:        General: Normal range of motion.  Skin:    General: Skin is warm and dry.  Neurological:     General: No focal deficit present.     Mental Status: She is alert and oriented to person, place, and time.  Psychiatric:        Mood and Affect: Mood normal.        Behavior: Behavior normal.        Thought Content: Thought content normal.        Judgment: Judgment normal.    BP 117/72    Pulse 99    Ht 5\' 3"  (1.6 m)    Wt 118 lb (53.5 kg)    SpO2 90%    BMI 20.90 kg/m  Wt Readings from Last 3 Encounters:  01/23/21 118 lb (53.5 kg)  01/11/21 119 lb (54 kg)  11/19/20 115 lb (52.2 kg)     Assessment & Plan:   Problem List Items  Addressed This Visit   None Visit Diagnoses     Encounter for initial prescription of Nexplanon    -  Primary   Nexplanon insertion         Reviewed medical history and recent labs with no contraindication to proceed. Procedure with instructions and risks explained to patient.  Verbal consent provided by patient prior to start.  Time out performed identifying correct patient, site, and procedure.   Patient positioned supine on exam table with non-dominant left arm bent at 90 degree angle exposing site of insertion.  Area cleansed with three chlorhexidine swabs and allowed to dry.  Insertion site identified and marked using sterile skin marker at 8cm superior and lateral to medial epicondyl and 3 cm posterior to the sulcus over the triceps muscle.  2cc 1% lidocaine without epinephrine with 0.2cc sodium bicarb prepared in 3 cc syringe with 1 1/2" 25g needle.  Local anesthetic instilled in subdermal layer with slow, continuous infusion of lidocaine solution along insertion line starting at proximal end and completing at distal end of mark.   Sterile packaging opened and sterile gloves donned.  After ensuring  appropriate anesthesia, application cartridge needle inserted at insertion mark at 30 degree angle then passed parallel to skin into the subdermal tissue layer to the complete length of the applicator needle shaft while holding skin taught at the medial epicondyl.  Once full needle inserted and ensuring location appropriate, trigger engaged and needle retracted.  Implant palpated directly under the skin by both myself and patient.  Pressure applied for 2 minutes with sterile gauze and steri strip applied to insertion point.  Sterile bandage applied over site and coban wrapped around arm to help promote hemostasis.  All supplies accounted for before and after procedure.  Patient tolerated procedure well.  Verbal and written instructions provided with emergency instructions included.    Discussed recommended removal at 3 years, which will be 01/23/2024. Reminder note in chart with scheduled MyChart message to patient scheduled to deliver on 11/04/2023 as reminder.    No orders of the defined types were placed in this encounter.   Follow-up: Return if symptoms worsen or fail to improve.    Tollie Eth, NP

## 2021-02-08 ENCOUNTER — Encounter (HOSPITAL_BASED_OUTPATIENT_CLINIC_OR_DEPARTMENT_OTHER): Payer: Self-pay | Admitting: Nurse Practitioner

## 2021-02-08 NOTE — Telephone Encounter (Signed)
Spoke with patient, she denied and SI or HI She is schedueld to see SBE on 01/10 to discuss

## 2021-02-12 ENCOUNTER — Ambulatory Visit (HOSPITAL_BASED_OUTPATIENT_CLINIC_OR_DEPARTMENT_OTHER): Payer: BC Managed Care – PPO | Admitting: Nurse Practitioner

## 2021-02-12 ENCOUNTER — Encounter (HOSPITAL_BASED_OUTPATIENT_CLINIC_OR_DEPARTMENT_OTHER): Payer: Self-pay | Admitting: Nurse Practitioner

## 2021-02-12 ENCOUNTER — Other Ambulatory Visit: Payer: Self-pay

## 2021-02-12 VITALS — BP 90/55 | HR 71 | Wt 119.0 lb

## 2021-02-12 DIAGNOSIS — F419 Anxiety disorder, unspecified: Secondary | ICD-10-CM

## 2021-02-12 DIAGNOSIS — F331 Major depressive disorder, recurrent, moderate: Secondary | ICD-10-CM | POA: Diagnosis not present

## 2021-02-12 MED ORDER — SERTRALINE HCL 100 MG PO TABS: 150.0000 mg | ORAL_TABLET | Freq: Every day | ORAL | 11 refills | Status: DC

## 2021-02-12 NOTE — Patient Instructions (Signed)
We will plan to touch base in 2 weeks with a phone call to see how you are feeling.  If at any point you feel unstable or need to talk, please reach out.

## 2021-02-14 NOTE — Assessment & Plan Note (Signed)
Discussed management options with patient which include addition of separate agent, increase in sertraline, or removal of Nexplanon implant. At this time the patient is enjoying the benefits of the Nexplanon and would like to consider an increase in the current dose of medication before adding another medication on. We will work to increase the sertraline to 150 mg/day and continue to monitor for symptoms. Discussed with patient that side effects of the implant can improve over time and may resolve themselves within a few months if this is the case at that time we can consider reduction of the medication.  If she is not having full solution with the increased dose we will consider adding Wellbutrin or alternative birth control methods. We will plan to follow-up in 2 weeks with a phone call to check and see how she is doing.

## 2021-02-14 NOTE — Progress Notes (Signed)
Established Patient Office Visit  Subjective:  Patient ID: Katrina Thompson, female    DOB: September 01, 1990  Age: 31 y.o. MRN: [id number]  CC:  Chief Complaint  Patient presents with   Medication Reaction    HPI Katrina Thompson presents for increased depression symptoms since starting Nexplanon implant last month.  She reports that she has had significant decreased mood including tearful episodes and one event having to leave work Katrina Thompson due to inability to control emotions.  She has previously been well controlled on sertraline 100 mg daily with no abnormal or adverse effects to the medication.  She denies SI/HI.  Past Medical History:  Diagnosis Date   Allergy    Anxiety    Bipolar disorder ([place])    Depression    Migraine     History reviewed. No pertinent surgical history.  History reviewed. No pertinent family history.  Social History   Socioeconomic History   Marital status: Single    Spouse name: Not on file   Number of children: Not on file   Years of education: Not on file   Highest education level: Not on file  Occupational History   Not on file  Tobacco Use   Smoking status: Never   Smokeless tobacco: Never  Vaping Use   Vaping Use: Never used  Substance and Sexual Activity   Alcohol use: No   Drug use: No   Sexual activity: Yes    Birth control/protection: Implant  Other Topics Concern   Not on file  Social History Narrative   Not on file   Social Determinants of Health   Financial Resource Strain: Not on file  Food Insecurity: Not on file  Transportation Needs: Not on file  Physical Activity: Not on file  Stress: Not on file  Social Connections: Not on file  Intimate Partner Violence: Not on file    Outpatient Medications Prior to Visit  Medication Sig Dispense Refill   etonogestrel (NEXPLANON) 68 MG IMPL implant      sertraline (ZOLOFT) 100 MG tablet Take 1 tablet (100 mg total) by mouth daily. 30 tablet 11   No facility-administered medications  prior to visit.    Allergies  Allergen Reactions   Keflex [Cephalexin] Hives    ROS Review of Systems All review of systems negative except what is listed in the HPI    Objective:    Physical Exam Vitals and nursing note reviewed.  Constitutional:      Appearance: Normal appearance.  HENT:     Head: Normocephalic.  Eyes:     Extraocular Movements: Extraocular movements intact.     Conjunctiva/sclera: Conjunctivae normal.     Pupils: Pupils are equal, round, and reactive to light.  Neck:     Vascular: No carotid bruit.  Cardiovascular:     Rate and Rhythm: Normal rate and regular rhythm.     Pulses: Normal pulses.     Heart sounds: Normal heart sounds.  Pulmonary:     Effort: Pulmonary effort is normal.     Breath sounds: Normal breath sounds.  Musculoskeletal:     Cervical back: Normal range of motion.     Right lower leg: No edema.     Left lower leg: No edema.  Skin:    General: Skin is warm and dry.     Capillary Refill: Capillary refill takes less than 2 seconds.  Neurological:     General: No focal deficit present.     Mental Status: She is alert and  oriented to person, place, and time.  Psychiatric:        Mood and Affect: Mood normal.        Behavior: Behavior normal.        Thought Content: Thought content normal.        Judgment: Judgment normal.    BP (!) 90/55    Pulse 71    Wt 119 lb (54 kg)    SpO2 100%    BMI 21.08 kg/m  Wt Readings from Last 3 Encounters:  02/12/21 119 lb (54 kg)  01/23/21 118 lb (53.5 kg)  01/11/21 119 lb (54 kg)     Health Maintenance Due  Topic Date Due   COVID-19 Vaccine (1) Never done   HIV Screening  Never done   Hepatitis C Screening  Never done   TETANUS/TDAP  Never done   PAP-Cervical Cytology Screening  Never done   PAP SMEAR-Modifier  Never done    There are no preventive care reminders to display for this patient.  No results found for: TSH Lab Results  Component Value Date   WBC 7.8 WHITE COUNT  CONFIRMED ON SMEAR 08/27/2009   HGB 15.3 (H) 08/27/2009   HCT 45.0 08/27/2009   MCV 87.1 08/27/2009   PLT PLATELET CLUMPS NOTED ON SMEAR, UNABLE TO ESTIMATE 08/27/2009   Lab Results  Component Value Date   NA 135 08/27/2009   K 3.6 08/27/2009   GLUCOSE 102 (H) 08/27/2009   BUN <3 (L) 08/27/2009   CREATININE 0.8 08/27/2009   No results found for: CHOL No results found for: HDL No results found for: LDLCALC No results found for: TRIG No results found for: CHOLHDL No results found for: HGBA1C    Assessment & Plan:   Problem List Items Addressed This Visit     Depression - Primary    Discussed management options with patient which include addition of separate agent, increase in sertraline, or removal of Nexplanon implant. At this time the patient is enjoying the benefits of the Nexplanon and would like to consider an increase in the current dose of medication before adding another medication on. We will work to increase the sertraline to 150 mg/day and continue to monitor for symptoms. Discussed with patient that side effects of the implant can improve over time and may resolve themselves within a few months if this is the case at that time we can consider reduction of the medication.  If she is not having full solution with the increased dose we will consider adding Wellbutrin or alternative birth control methods. We will plan to follow-up in 2 weeks with a phone call to check and see how she is doing.      Relevant Medications   sertraline (ZOLOFT) 100 MG tablet   Anxiety   Relevant Medications   sertraline (ZOLOFT) 100 MG tablet    Meds ordered this encounter  Medications   sertraline (ZOLOFT) 100 MG tablet    Sig: Take 1.5 tablets (150 mg total) by mouth at bedtime.    Dispense:  45 tablet    Refill:  11    Follow-up: Return in about 2 weeks (around 02/26/2021) for Phone call - mood.    Orma Render, NP

## 2021-02-27 ENCOUNTER — Ambulatory Visit (INDEPENDENT_AMBULATORY_CARE_PROVIDER_SITE_OTHER): Payer: BC Managed Care – PPO | Admitting: Nurse Practitioner

## 2021-02-27 ENCOUNTER — Other Ambulatory Visit: Payer: Self-pay

## 2021-02-27 ENCOUNTER — Encounter (HOSPITAL_BASED_OUTPATIENT_CLINIC_OR_DEPARTMENT_OTHER): Payer: Self-pay | Admitting: Nurse Practitioner

## 2021-02-27 DIAGNOSIS — N926 Irregular menstruation, unspecified: Secondary | ICD-10-CM | POA: Diagnosis not present

## 2021-02-27 DIAGNOSIS — F331 Major depressive disorder, recurrent, moderate: Secondary | ICD-10-CM

## 2021-02-27 HISTORY — DX: Irregular menstruation, unspecified: N92.6

## 2021-02-27 MED ORDER — MEDROXYPROGESTERONE ACETATE 10 MG PO TABS: 10.0000 mg | ORAL_TABLET | Freq: Every day | ORAL | 0 refills | Status: DC

## 2021-02-27 NOTE — Patient Instructions (Addendum)
We will try progesterone 10mg  once a day for 10 days to get the bleeding to become more scheduled. Hopefully this will get you on a routine schedule.   You should expect to have a period when you stop the progesterone and it should be light. It should not last more than 5-6 days. If ou have bleeding longer than this, let me know.   I can say that typically after 3 months this stabilizes on its own so we can try managing it until then if there are issues.

## 2021-02-27 NOTE — Progress Notes (Signed)
Virtual Visit Encounter  telephone visit.   I connected with  Katrina Thompson on 02/27/21 at 10:50 AM EST by secure audio and/or video enabled telemedicine application. I verified that I am speaking with the correct person using two identifiers.   I introduced myself as a Publishing rights manager with the practice. The limitations of evaluation and management by telemedicine discussed with the patient and the availability of in person appointments. The patient expressed verbal understanding and consent to proceed.  Participating parties in this visit include: Myself and patient  The patient is: Patient Location: Home I am: Provider Location: Office/Clinic Subjective:    CC and HPI: Katrina Thompson is a 31 y.o. year old female presenting for follow up of mood. She is also having concerns with prolonged bleeding after nexplanon implant.  At her last visit she had concerns with significant exacerbation of depressive symptoms after initiation of nexplanon. She was having concerns with significant sadness and tearfulness. At that visit we increased her sertraline dosage and planned to monitor for symptom changes. She tells me since increasing the dose of sertraline her mood is much improved and she is not having episodes of tearfulness or increased sadness at this time. She feels that the current dose is appropriate at managing her symptoms.   She does have concern that she started her menstrual cycle on the 9th of this month and has continued to have light bleeding since that time. She reports the bleeding is not heavy, but continuous.   Past medical history, Surgical history, Family history not pertinant except as noted below, Social history, Allergies, and medications have been entered into the medical record, reviewed, and corrections made.   Review of Systems:  All review of systems negative except what is listed in the HPI  Objective:    Alert and oriented x 4 Speaking in clear sentences with no  shortness of breath. No distress.  Impression and Recommendations:    Problem List Items Addressed This Visit     Depression    Symptoms much improved from increasing sertraline dose two weeks ago.  No new or worsening symptoms at this time.  Will plan to continue on this dose at this time. May consider taper down in a few months if patient is found to be stable.  She is aware to contact the office if there are any new or concerning findings.       Abnormal menses - Primary    Abnormal uterine bleeding after nexplanon insertion.  Will trail progesterone 10 day treatment option to help stimulate normal bleeding pattern.  If symptoms continue, may consider low dose OCP addition for 3 months until bleeding patterns are well established for improved control. She will contact the office if symptoms persist or return.       Relevant Medications   medroxyPROGESTERone (PROVERA) 10 MG tablet    orders and follow up as documented in EMR I discussed the assessment and treatment plan with the patient. The patient was provided an opportunity to ask questions and all were answered. The patient agreed with the plan and demonstrated an understanding of the instructions.   The patient was advised to call back or seek an in-person evaluation if the symptoms worsen or if the condition fails to improve as anticipated.  Follow-Up: in 6 months  I provided 20 minutes of non-face-to-face interaction with this non face-to-face encounter including intake, same-day documentation, and chart review.   Tollie Eth, NP , DNP, AGNP-c [facility]  Primary Care & Sports Medicine at [facility] [phone] (fax)

## 2021-02-27 NOTE — Assessment & Plan Note (Signed)
Symptoms much improved from increasing sertraline dose two weeks ago.  No new or worsening symptoms at this time.  Will plan to continue on this dose at this time. May consider taper down in a few months if patient is found to be stable.  She is aware to contact the office if there are any new or concerning findings.

## 2021-02-27 NOTE — Assessment & Plan Note (Signed)
Abnormal uterine bleeding after nexplanon insertion.  Will trail progesterone 10 day treatment option to help stimulate normal bleeding pattern.  If symptoms continue, may consider low dose OCP addition for 3 months until bleeding patterns are well established for improved control. She will contact the office if symptoms persist or return.

## 2021-03-05 ENCOUNTER — Other Ambulatory Visit (HOSPITAL_BASED_OUTPATIENT_CLINIC_OR_DEPARTMENT_OTHER): Payer: Self-pay | Admitting: Nurse Practitioner

## 2021-03-05 DIAGNOSIS — N926 Irregular menstruation, unspecified: Secondary | ICD-10-CM

## 2021-03-17 ENCOUNTER — Encounter (HOSPITAL_BASED_OUTPATIENT_CLINIC_OR_DEPARTMENT_OTHER): Payer: Self-pay | Admitting: Nurse Practitioner

## 2021-03-17 DIAGNOSIS — N926 Irregular menstruation, unspecified: Secondary | ICD-10-CM

## 2021-03-21 ENCOUNTER — Other Ambulatory Visit (HOSPITAL_BASED_OUTPATIENT_CLINIC_OR_DEPARTMENT_OTHER): Payer: Self-pay | Admitting: Nurse Practitioner

## 2021-03-21 MED ORDER — NORETHINDRONE ACET-ETHINYL EST 1-20 MG-MCG PO TABS: ORAL_TABLET | ORAL | 1 refills | Status: DC

## 2021-07-08 ENCOUNTER — Encounter (HOSPITAL_BASED_OUTPATIENT_CLINIC_OR_DEPARTMENT_OTHER): Payer: Self-pay | Admitting: Nurse Practitioner

## 2021-07-30 ENCOUNTER — Ambulatory Visit (INDEPENDENT_AMBULATORY_CARE_PROVIDER_SITE_OTHER): Payer: BC Managed Care – PPO | Admitting: Nurse Practitioner

## 2021-07-30 DIAGNOSIS — F419 Anxiety disorder, unspecified: Secondary | ICD-10-CM | POA: Diagnosis not present

## 2021-07-30 DIAGNOSIS — F331 Major depressive disorder, recurrent, moderate: Secondary | ICD-10-CM

## 2021-07-30 MED ORDER — BUSPIRONE HCL 5 MG PO TABS: 5.0000 mg | ORAL_TABLET | Freq: Three times a day (TID) | ORAL | 3 refills | Status: DC | PRN

## 2021-07-30 NOTE — Progress Notes (Signed)
Virtual Visit Encounter telephone visit.   I connected with  Katrina Thompson on 08/14/21 at  2:30 PM EDT by secure audio telemedicine application. I verified that I am speaking with the correct person using two identifiers.   I introduced myself as a Publishing rights manager with the practice. The limitations of evaluation and management by telemedicine discussed with the patient and the availability of in person appointments. The patient expressed verbal understanding and consent to proceed.  Participating parties in this visit include: Myself and patient  The patient is: Patient Location: Home I am: Provider Location: Office/Clinic Subjective:    CC and HPI: Katrina Thompson is a 31 y.o. year old female presenting for follow up of mood. Patient reports the following: The patient reports feeling generally good at present. She is currently taking 100mg  of sertraline daily for her mental health. Approximately two weeks ago, she experienced a period of feeling "down" and increased her sertraline dosage to 200mg  per day (from 150mg ). However, she reported feeling "stoned" after taking the increased dose for about a week. As a result, she independently tapered back down to her previous dose of 100mg . Since then, the patient has been feeling much better and has not experienced any side effects on the current dose. Although she still experiences some anxiety in social settings, she is managing well in routine environments. The patient denies any suicidal ideation (SI) or homicidal ideation (HI).  Past medical history, Surgical history, Family history not pertinant except as noted below, Social history, Allergies, and medications have been entered into the medical record, reviewed, and corrections made.   Review of Systems:  All review of systems negative except what is listed in the HPI  Objective:    Alert and oriented x 4 Speaking in clear sentences with no shortness of breath. No distress.  Impression  and Recommendations:    Problem List Items Addressed This Visit     Anxiety    Patient indicates a positive response to sertraline at a daily dose of 100 mg with improvement in mood.  She has experienced adverse effects including feeling "stoned" when she temporarily increase the dose up to 200 mg after a brief.  Of depressive symptoms.  She does continue to have some residual anxiety in social settings but overall is functioning well.  At this time I do feel that continuing on the current dose of 100 mg as appropriate as she is feeling better with this dose and not having any negative side effects.  Given the fact that she is having intermittent anxiety exacerbations in certain social settings, discussed the option of adding as needed BuSpar to patient's medication regimen.  She is interested in trying this to see how it works for her.  Instructions provided today.  There are no alarm symptoms present at this time.  Encouraged patient to monitor for any changes in mood or thoughts of self-harm and report immediately.  As long as patient remains stable we will plan to follow-up in the next 3 to 6 months with virtual visit to see how she is doing.      Relevant Medications   busPIRone (BUSPAR) 5 MG tablet   Depression - Primary   Relevant Medications   busPIRone (BUSPAR) 5 MG tablet    orders and follow up as documented in EMR I discussed the assessment and treatment plan with the patient. The patient was provided an opportunity to ask questions and all were answered. The patient agreed with the plan and demonstrated  an understanding of the instructions.   The patient was advised to call back or seek an in-person evaluation if the symptoms worsen or if the condition fails to improve as anticipated.  Follow-Up: 3-6 months  I provided 18 minutes of non-face-to-face interaction with this non face-to-face encounter including intake, same-day documentation, and chart review.   Tollie Eth, NP ,  DNP, AGNP-c [facility] Primary Care & Sports Medicine at [facility] [phone] (fax)

## 2021-08-14 ENCOUNTER — Encounter (HOSPITAL_BASED_OUTPATIENT_CLINIC_OR_DEPARTMENT_OTHER): Payer: Self-pay | Admitting: Nurse Practitioner

## 2021-08-14 NOTE — Assessment & Plan Note (Signed)
Patient indicates a positive response to sertraline at a daily dose of 100 mg with improvement in mood.  She has experienced adverse effects including feeling "stoned" when she temporarily increase the dose up to 200 mg after a brief.  Of depressive symptoms.  She does continue to have some residual anxiety in social settings but overall is functioning well.  At this time I do feel that continuing on the current dose of 100 mg as appropriate as she is feeling better with this dose and not having any negative side effects.  Given the fact that she is having intermittent anxiety exacerbations in certain social settings, discussed the option of adding as needed BuSpar to patient's medication regimen.  She is interested in trying this to see how it works for her.  Instructions provided today.  There are no alarm symptoms present at this time.  Encouraged patient to monitor for any changes in mood or thoughts of self-harm and report immediately.  As long as patient remains stable we will plan to follow-up in the next 3 to 6 months with virtual visit to see how she is doing.

## 2021-08-27 ENCOUNTER — Telehealth (HOSPITAL_BASED_OUTPATIENT_CLINIC_OR_DEPARTMENT_OTHER): Payer: BC Managed Care – PPO | Admitting: Nurse Practitioner

## 2021-10-22 DIAGNOSIS — N3000 Acute cystitis without hematuria: Secondary | ICD-10-CM | POA: Diagnosis not present

## 2021-10-22 DIAGNOSIS — R300 Dysuria: Secondary | ICD-10-CM | POA: Diagnosis not present

## 2021-10-22 DIAGNOSIS — Z3202 Encounter for pregnancy test, result negative: Secondary | ICD-10-CM | POA: Diagnosis not present

## 2022-03-10 ENCOUNTER — Other Ambulatory Visit (HOSPITAL_BASED_OUTPATIENT_CLINIC_OR_DEPARTMENT_OTHER): Payer: Self-pay | Admitting: Nurse Practitioner

## 2022-03-10 DIAGNOSIS — F419 Anxiety disorder, unspecified: Secondary | ICD-10-CM

## 2022-03-10 NOTE — Telephone Encounter (Signed)
Refill request last apt 07/30/21 and I sent pt. A message to schedule an apt. With you here.

## 2022-06-09 ENCOUNTER — Telehealth: Payer: BC Managed Care – PPO | Admitting: Nurse Practitioner

## 2022-06-09 ENCOUNTER — Encounter: Payer: Self-pay | Admitting: Nurse Practitioner

## 2022-06-09 VITALS — Wt 120.0 lb

## 2022-06-09 DIAGNOSIS — F32A Depression, unspecified: Secondary | ICD-10-CM | POA: Diagnosis not present

## 2022-06-09 DIAGNOSIS — F419 Anxiety disorder, unspecified: Secondary | ICD-10-CM

## 2022-06-09 DIAGNOSIS — F329 Major depressive disorder, single episode, unspecified: Secondary | ICD-10-CM

## 2022-06-09 DIAGNOSIS — N3000 Acute cystitis without hematuria: Secondary | ICD-10-CM

## 2022-06-09 HISTORY — DX: Acute cystitis without hematuria: N30.00

## 2022-06-09 MED ORDER — NITROFURANTOIN MONOHYD MACRO 100 MG PO CAPS: 100.0000 mg | ORAL_CAPSULE | Freq: Two times a day (BID) | ORAL | 0 refills | Status: DC

## 2022-06-09 NOTE — Assessment & Plan Note (Signed)
Symptoms and presentation are consistent with acute cystitis.  She is experiencing symptoms of urinary frequency, urgency, and dysuria.  We will plan to send nitrofurantoin for treatment.  Given that she is already started AZO as the treatment the UA will likely not be accurate therefore I do not feel it is necessary to come in for this at this time.  If she continues to have symptoms following treatment I do recommend urinalysis for further evaluation. Plan: -Nitrofurantoin sent to pharmacy for UTI treatment. -If she begins to have symptoms of vaginal irritation, discharge, itching she will contact the office for Diflucan.  No need for appointment for this. -Follow-up if no improvement in symptoms.

## 2022-06-09 NOTE — Assessment & Plan Note (Signed)
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  At this time she is doing very well off of medication and does not feel that this is necessary at this time.  Given the significant improvement in her symptoms I feel it is completely reasonable to stop the medication and just monitor.  She will follow-up if her symptoms worsen or she begins to have any symptoms. Plan: -Let me know if you begin to have symptoms of depression or anxiety creep up and we can always restart the medications as needed. -I am glad you are doing so well!

## 2022-06-09 NOTE — Progress Notes (Signed)
Virtual Visit Encounter mychart visit.   I connected with  Katrina Thompson on 06/09/22 at  8:15 AM EDT by secure video and audio telemedicine application. I verified that I am speaking with the correct person using two identifiers.   I introduced myself as a Publishing rights manager with the practice. The limitations of evaluation and management by telemedicine discussed with the patient and the availability of in person appointments. The patient expressed verbal understanding and consent to proceed.  Participating parties in this visit include: Myself and patient  The patient is: Patient Location: Other:  car I am: Provider Location: Home Office Subjective:    CC and HPI: Katrina Thompson is a 32 y.o. year old female presenting for follow up of Mood. Also having urinary symptoms.  Patient reports the following:  Katrina Thompson presents today for follow-up of her mood. She tells me that she got out of the habit of taking her medication over the last few months and then tried to restart. Upon restarting she developed headaches and then tapered herself off. She tells me that since tapering off the medication she has not had any concerns with her mood and feels that she is doing quite well at this time.  At the time that she started her medication she was going through a divorce and that has since finalized and she is doing quite well emotionally.   Katrina Thompson does tell me that she is experiencing symptoms of UTI with increased urgency burning with urination.  She reports that the symptoms started yesterday evening and she began taking Azo which has helped with the symptoms.  She is not having any fevers, chills, body aches.  She does have a history of chronic UTIs.   Past medical history, Surgical history, Family history not pertinant except as noted below, Social history, Allergies, and medications have been entered into the medical record, reviewed, and corrections made.   Review of Systems:  All review of systems  negative except what is listed in the HPI  Objective:    Alert and oriented x 4 Speaking in clear sentences with no shortness of breath. No distress.  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  At this time she is doing very well off of medication and does not feel that this is necessary at this time.  Given the significant improvement in her symptoms I feel it is completely reasonable to stop the medication and just monitor.  She will follow-up if her symptoms worsen or she begins to have any symptoms. Plan: -Let me know if you begin to have symptoms of depression or anxiety creep up and we can always restart the medications as needed. -I am glad you are doing so well!      Acute cystitis without hematuria - Primary    Symptoms and presentation are consistent with acute cystitis.  She is experiencing symptoms of urinary frequency, urgency, and dysuria.  We will plan to send nitrofurantoin for treatment.  Given that she is already started AZO as the treatment the UA will likely not be accurate therefore I do not feel it is necessary to come in for this at this time.  If she continues to have symptoms following treatment I do recommend urinalysis for further evaluation. Plan: -Nitrofurantoin sent to pharmacy for UTI treatment. -If she begins to have symptoms of vaginal irritation, discharge, itching she  will contact the office for Diflucan.  No need for appointment for this. -Follow-up if no improvement in symptoms.      Relevant Medications   nitrofurantoin, macrocrystal-monohydrate, (MACROBID) 100 MG capsule   Anxiety    orders and follow up as documented in EMR I discussed the assessment and treatment plan with the patient. The patient was provided an opportunity to ask questions and all were answered. The patient agreed with the plan and demonstrated an  understanding of the instructions.   The patient was advised to call back or seek an in-person evaluation if the symptoms worsen or if the condition fails to improve as anticipated.  Follow-Up: prn  I provided 10 minutes of non-face-to-face interaction with this non face-to-face encounter including intake, same-day documentation, and chart review.   Tollie Eth, NP , DNP, AGNP-c [facility]

## 2022-06-16 ENCOUNTER — Encounter: Payer: Self-pay | Admitting: Nurse Practitioner

## 2022-06-19 ENCOUNTER — Telehealth: Payer: Self-pay | Admitting: Nurse Practitioner

## 2022-06-19 ENCOUNTER — Other Ambulatory Visit: Payer: Self-pay

## 2022-06-19 DIAGNOSIS — F321 Major depressive disorder, single episode, moderate: Secondary | ICD-10-CM

## 2022-06-19 MED ORDER — SERTRALINE HCL 50 MG PO TABS
25.0000 mg | ORAL_TABLET | Freq: Every day | ORAL | 3 refills | Status: DC
Start: 2022-06-19 — End: 2022-06-19

## 2022-06-19 MED ORDER — SERTRALINE HCL 50 MG PO TABS
25.0000 mg | ORAL_TABLET | Freq: Every day | ORAL | 3 refills | Status: DC
Start: 2022-06-19 — End: 2023-07-30

## 2022-06-19 NOTE — Telephone Encounter (Signed)
Sertraline resent for patient. OK to start at 25mg . May increase to 50mg  after 4 nights if she would like. Please let her know.

## 2022-06-19 NOTE — Telephone Encounter (Signed)
Pt called again regarding her Sertraline & wants to restart it.  Asks if you could please let her know ASAP

## 2022-06-27 NOTE — Telephone Encounter (Signed)
DONE

## 2022-12-10 ENCOUNTER — Telehealth: Payer: Self-pay | Admitting: Nurse Practitioner

## 2022-12-10 ENCOUNTER — Other Ambulatory Visit: Payer: Self-pay

## 2022-12-10 DIAGNOSIS — F331 Major depressive disorder, recurrent, moderate: Secondary | ICD-10-CM

## 2022-12-10 DIAGNOSIS — F419 Anxiety disorder, unspecified: Secondary | ICD-10-CM

## 2022-12-10 MED ORDER — BUSPIRONE HCL 5 MG PO TABS
5.0000 mg | ORAL_TABLET | Freq: Three times a day (TID) | ORAL | 0 refills | Status: DC | PRN
Start: 2022-12-10 — End: 2023-07-30

## 2022-12-10 NOTE — Telephone Encounter (Signed)
Pt called & states she is having a lot of increased, bad anxiety & would like a refill on her Buspar to [facility], said you have filled it in the past & uses it only as needed & really needs it now.

## 2022-12-20 NOTE — Telephone Encounter (Signed)
done

## 2023-01-16 ENCOUNTER — Telehealth: Payer: BC Managed Care – PPO | Admitting: Nurse Practitioner

## 2023-02-01 ENCOUNTER — Encounter: Payer: Self-pay | Admitting: Nurse Practitioner

## 2023-02-23 ENCOUNTER — Encounter: Payer: Self-pay | Admitting: Nurse Practitioner

## 2023-02-23 ENCOUNTER — Ambulatory Visit: Payer: BC Managed Care – PPO | Admitting: Nurse Practitioner

## 2023-02-23 VITALS — BP 120/82 | HR 72 | Wt 127.8 lb

## 2023-02-23 DIAGNOSIS — Z3046 Encounter for surveillance of implantable subdermal contraceptive: Secondary | ICD-10-CM

## 2023-02-23 MED ORDER — ETONOGESTREL 68 MG ~~LOC~~ IMPL
68.0000 mg | DRUG_IMPLANT | Freq: Once | SUBCUTANEOUS | Status: AC
Start: 2023-02-23 — End: ?

## 2023-02-23 NOTE — Progress Notes (Signed)
Tollie Eth, DNP, AGNP-c [facility] [address] [place], [place] [address] [phone]   ACUTE VISIT- ESTABLISHED PATIENT  Blood pressure 120/82, pulse 72, weight 127 lb 12.8 oz (58 kg).  Subjective:  HPI Katrina Thompson is a 33 y.o. female presents to day for evaluation of acute concern(s).   Removal and reinsertion of nexplanon implant into left upper extremity.    ROS negative except for what is listed in HPI. History, Medications, Surgery, SDOH, and Family History reviewed and updated as appropriate.  Objective:  Physical Exam Vitals and nursing note reviewed.  Constitutional:      General: She is not in acute distress.    Appearance: Normal appearance.  HENT:     Head: Normocephalic.  Eyes:     General: No scleral icterus.    Conjunctiva/sclera: Conjunctivae normal.  Cardiovascular:     Pulses: Normal pulses.  Pulmonary:     Effort: Pulmonary effort is normal.  Musculoskeletal:        General: Normal range of motion.     Cervical back: Normal range of motion.  Skin:    General: Skin is warm and dry.     Capillary Refill: Capillary refill takes less than 2 seconds.  Neurological:     Mental Status: She is alert and oriented to person, place, and time.     Sensory: No sensory deficit.     Motor: No weakness.  Psychiatric:        Mood and Affect: Mood normal.        Behavior: Behavior normal.        Thought Content: Thought content normal.        Judgment: Judgment normal.          Assessment & Plan:   Problem List Items Addressed This Visit     Encounter for removal and reinsertion of Nexplanon - Primary   Pre-Op Diagnosis: Nexplanon, desire for continuation Post-Op Diagnosis: Same Procedure: Nexplanon Removal and Replacement Performing Provider: Tollie Eth, DNP, AGNP-c Assistant: Clyda Hurdle, CMA  Procedure: Anesthesia: 1% lidocaine with epinephrine 5mL with sodium bicarbonate 1mL Consent obtained.  A time out was  performed prior to initiation to ensure right patient, right location, right procedure.  Patient was placed in supine position with left arm flexed in a 90 degree angle for access.  The device was palpated in the arm to ensure location.  The area surrounding the Nexplanon was swabbed with betadine x 2 and draped in the usual sterile manner.  The site was anesthetized with  lidocaine/epinephrine/bicarbonate mixture in ratios listed above utilizing a 25g 1.5" needle.  The device was palpated at the proximal end and the distal end of the device was raised causing slight tenting of the skin.  1cm incision was made over the distal end of the device with #11 blade.  The device was grasped with hemostats and the capsule was lysed sharply.  The device was removed with the hemostats without incident. Device was inspected and found to be complete without any signs of damage.  Pressure was applied to removal site.  Immediately following removal, insertion began.  Area was evaluated to ensure safe reinsertion.  A new site was located 2-56mm inferior to the removal site and measurements completed.  Assurance that area was anesthetized adequately were made. No additional anesthetic was required.  The nexplanon cartridge was removed from package and assurance that expiration was within date.  Skin was pulled taught at the distal end and Nexplanon device  was glided gently into the skin and just under the surface of the skin until the entire needle was inserted.  Cartridge lever was pulled and assurance that device was in place made.  Area covered with steri strips and pressure dressing applied to both removal and insertion site.  Patient tolerated entire procedure well.   Follow-up: The patient tolerated the procedure without complication. Standard post-procedure care was explained and return precautions provided in verbal and written form.       Relevant Medications   etonogestrel (NEXPLANON) implant 68 mg    Other Relevant Orders   Removal of implanon rod      Tollie Eth, DNP, AGNP-c

## 2023-02-23 NOTE — Assessment & Plan Note (Signed)
Pre-Op Diagnosis: Nexplanon, desire for continuation Post-Op Diagnosis: Same Procedure: Nexplanon Removal and Replacement Performing Provider: Tollie Eth, DNP, AGNP-c Assistant: Clyda Hurdle, CMA  Procedure: Anesthesia: 1% lidocaine with epinephrine 5mL with sodium bicarbonate 1mL Consent obtained.  A time out was performed prior to initiation to ensure right patient, right location, right procedure.  Patient was placed in supine position with left arm flexed in a 90 degree angle for access.  The device was palpated in the arm to ensure location.  The area surrounding the Nexplanon was swabbed with betadine x 2 and draped in the usual sterile manner.  The site was anesthetized with  lidocaine/epinephrine/bicarbonate mixture in ratios listed above utilizing a 25g 1.5" needle.  The device was palpated at the proximal end and the distal end of the device was raised causing slight tenting of the skin.  1cm incision was made over the distal end of the device with #11 blade.  The device was grasped with hemostats and the capsule was lysed sharply.  The device was removed with the hemostats without incident. Device was inspected and found to be complete without any signs of damage.  Pressure was applied to removal site.  Immediately following removal, insertion began.  Area was evaluated to ensure safe reinsertion.  A new site was located 2-43mm inferior to the removal site and measurements completed.  Assurance that area was anesthetized adequately were made. No additional anesthetic was required.  The nexplanon cartridge was removed from package and assurance that expiration was within date.  Skin was pulled taught at the distal end and Nexplanon device was glided gently into the skin and just under the surface of the skin until the entire needle was inserted.  Cartridge lever was pulled and assurance that device was in place made.  Area covered with steri strips and pressure dressing applied to  both removal and insertion site.  Patient tolerated entire procedure well.   Follow-up: The patient tolerated the procedure without complication. Standard post-procedure care was explained and return precautions provided in verbal and written form.

## 2023-02-23 NOTE — Patient Instructions (Signed)
Nexplanon Post Removal Care  You will need to wear the compression bandage applied to the site for 24 hours. It is important to keep the pressure dressing dry for the entire time you wear it.   Remove the coban and gauze after 24 hours and place the bandage provided over the site. Do not remove the small strips. Keep the newly applied dressing in place for 3-5 days.   After removing the second bandage, the steri strips are to be left in place. The will begin to lift at their ends with time. Simply cut the loose ends away and keep the remainder of the strip in place until it falls off naturally.   You may have some bruising, discoloration, swelling, and pain at the site for 1-3 weeks after removal. These symptoms are normal.  While the incision continues to heal, be careful not to pull or bump the area. There are no restrictions with lifting.   Once the incision heals, you may go about your normal activities.    If you experience any of the following, contact the office immediately: Redness (especially redness moving out from the incision or up the arm) Constant pain at the site Fever more than 101 degrees Drainage, pus, or heavy bleeding from the site     Monitor the area routinely by checking that you can feel the implant in place. If you cannot, please contact the office immediately.

## 2023-07-29 ENCOUNTER — Encounter: Payer: Self-pay | Admitting: Nurse Practitioner

## 2023-07-30 ENCOUNTER — Other Ambulatory Visit: Payer: Self-pay | Admitting: Nurse Practitioner

## 2023-07-30 ENCOUNTER — Other Ambulatory Visit: Payer: Self-pay

## 2023-07-30 DIAGNOSIS — F321 Major depressive disorder, single episode, moderate: Secondary | ICD-10-CM

## 2023-07-30 DIAGNOSIS — F419 Anxiety disorder, unspecified: Secondary | ICD-10-CM

## 2023-07-30 DIAGNOSIS — F331 Major depressive disorder, recurrent, moderate: Secondary | ICD-10-CM

## 2023-07-30 NOTE — Telephone Encounter (Signed)
 Last apt 02/28/23

## 2023-08-19 ENCOUNTER — Encounter: Payer: Self-pay | Admitting: Nurse Practitioner

## 2023-08-21 ENCOUNTER — Ambulatory Visit: Admitting: Medical

## 2023-08-21 VITALS — BP 104/64 | HR 64 | Wt 115.4 lb

## 2023-08-21 DIAGNOSIS — M25552 Pain in left hip: Secondary | ICD-10-CM | POA: Diagnosis not present

## 2023-08-21 DIAGNOSIS — F419 Anxiety disorder, unspecified: Secondary | ICD-10-CM

## 2023-08-21 DIAGNOSIS — R4584 Anhedonia: Secondary | ICD-10-CM

## 2023-08-21 MED ORDER — SERTRALINE HCL 100 MG PO TABS: 100.0000 mg | ORAL_TABLET | Freq: Every day | ORAL | 1 refills | Status: AC

## 2023-08-21 NOTE — Progress Notes (Signed)
 Subjective:  Katrina Thompson is a 33 y.o. female who presents for Chief Complaint  Patient presents with   Follow-up    Follow-up on sertraline , still having symptoms- tired all the time, not excited or happy about anything, anxious- having to do deep breaths, cut out a lot of caffeine to see if that will help     Here for concerns about anxiety.  2-3 weeks ago was having some hard times, breakup, job stress, and felt like everything came to a head.  She had contacted her PCP here about going back on sertraline  and buspar .  Was on this same medicaiton fall 2024 and formerly did ok on this.    Has struggled with depression since young age.    Has been on several different medicaiton in the past, Wellbutrin, pristiq, others.    Was just started back on sertraline  3 weeks ago, and bupsar.   Has done ok on sertraline  in past with good response, but in the past week, tired all the time.    Doesn't matter how much she sleeps is still tired all the time, apathetic towards things.  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  Got a raise at work last week but this didn't excite her.  Has been on as high as 150mg  sertraline  in the past.  Was having a lot of crying spells recently but the sertraline  has helped with this.  Not currently seeing counseling, last counseling a few years ago.  No children.  Has cats  Is an accountant, 9 hour days.   Exercise - some with walking.     Does breathing exercises to help with stress.   Tries to hang out with friends and family to not ruminate on her thoughts.  Hobbies - reading.   No current volunteer work.  Lately having some left hip pain.  No injury, no trauma, no fall.   No other aggravating or relieving factors.    No other c/o.  Past Medical History:  Diagnosis Date   Abnormal menses 02/27/2021   Acute cystitis without hematuria 06/09/2022   Allergy    Anxiety    Bipolar disorder (HCC)    Depression    Encounter for surveillance of  contraceptives 01/11/2021   Migraine    Current Outpatient Medications on File Prior to Visit  Medication Sig Dispense Refill   sertraline  (ZOLOFT ) 50 MG tablet TAKE 1/2 TO 1 TABLET(25 TO 50 MG) BY MOUTH DAILY 30 tablet 3   busPIRone  (BUSPAR ) 5 MG tablet TAKE 1 TO 2 TABLETS(5 TO 10 MG) BY MOUTH THREE TIMES DAILY AS NEEDED (Patient not taking: Reported on 08/21/2023) 180 tablet 3   Current Facility-Administered Medications on File Prior to Visit  Medication Dose Route Frequency Provider Last Rate Last Admin   etonogestrel  (NEXPLANON ) implant 68 mg  68 mg Subdermal Once          The following portions of the patient's history were reviewed and updated as appropriate: allergies, current medications, past family history, past medical history, past social history, past surgical history and problem list.  ROS Otherwise as in subjective above    Objective: BP 104/64   Pulse 64   Wt 115 lb 6.4 oz (52.3 kg)   SpO2 99%   BMI 20.44 kg/m   General appearance: alert, no distress, well developed, well nourished Psych: pleasant, good eye contact, answers questions appropriate MSK: tender over left pelvis and lateral left hip and trochanter bursa, but no  swelling or deformity, ROM normal, rest of legs unremarkable Legs neurovascularly intact Pulses nl No extremity edema     Assessment: Encounter Diagnoses  Name Primary?   Anxiety Yes   Anhedonia    Left hip pain      Plan: Discussed anxiety, anhedonia . Increase to sertraline  100mg  daily.   Consider establish with counseling.  She has EAP program at work she can utilize.  Discussed other ways to deal with anxiety  We discussed ways to deal with stress and anxiety. I recommend regular exercise such as 30 minutes or more most days of the week such as walking running and bicycling I recommend taking some time to meditate or pray daily to help slow racing thoughts. I recommend working on relaxation techniques such as deep breathing  exercises in a comfortable position relaxing your body.  There are free Apps on the smart phone for this for example Consider getting a massage Journal or use diary to express your ideas on paper to cope with anxiety and stress Work on time management, use a calendar or plan out things to avoid stressing about things. Find ways to utilize your time to include exercise and personal me time. Some people use aromatherapy such as lavender to relax Some people use herbal teas to help calm their mood Spend some time with animals or your pet if you have one Consider seeing a counselor to help deal with anxiety and work on specific techniques  Left hip pain - continue walking but add stretching daily.  I demonstrated some stretching exercise for her to do.  If not improving in the next month, consider baseline xray  Return soon for baseline physical and labs with Catheline her PCP   Chayil was seen today for follow-up.  Diagnoses and all orders for this visit:  Anxiety  Anhedonia  Left hip pain  Other orders -     sertraline  (ZOLOFT ) 100 MG tablet; Take 1 tablet (100 mg total) by mouth daily.    Follow up: 50mo

## 2023-08-24 IMAGING — MR MR [PERSON_NAME]
11 series · 16 of 16 positions shown · non-contrast
Comparison: None.

CLINICAL DATA: Jaw stiffness and pain, clicking on the right

EXAM:
MRI OF TEMPOROMANDIBULAR JOINT WITHOUT CONTRAST
TECHNIQUE: Multiplanar, multisequence MR imaging of the temporomandibular joint
was performed following the standard protocol. No intravenous
contrast was administered.

[Series 4: T1 · axial · 4.0mm · 0.59mm/px · z∈[-68,-20]mm · 2 of 13 slices shown]
[im 1/13]
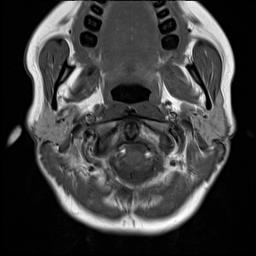
[im 13/13]
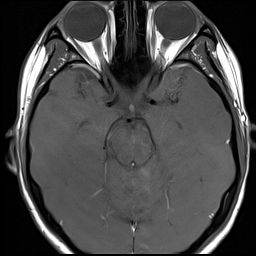

[Series 5: T2 fat-sat · sagittal · 4.0mm · 0.62mm/px · 1 of 11 slices shown (1 of 2)]
[im 1/11]
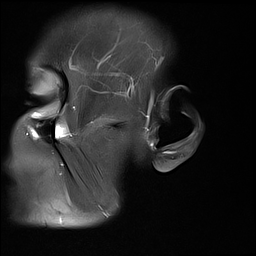

[Series 6: PD · coronal · 4.0mm · 0.44mm/px · 2 of 13 slices shown (1 of 8)]
[im 1/13]
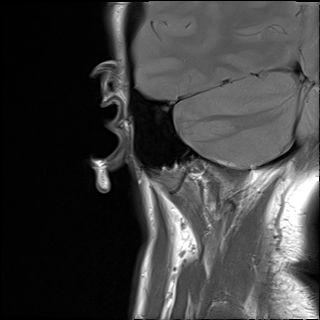
[im 13/13]
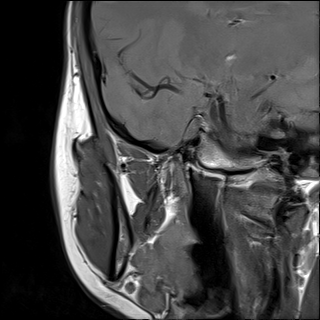

[Series 7: T2 fat-sat · sagittal · 4.0mm · 0.62mm/px · 1 of 11 slices shown (2 of 2)]
[im 1/11]
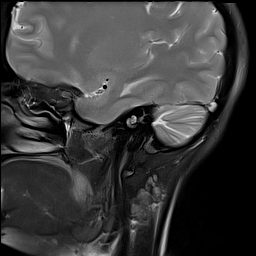

[Series 8: PD · coronal · 4.0mm · 0.44mm/px · 2 of 13 slices shown (2 of 8)]
[im 1/13]
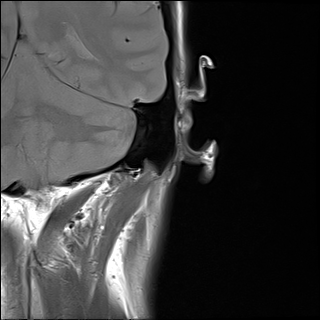
[im 13/13]
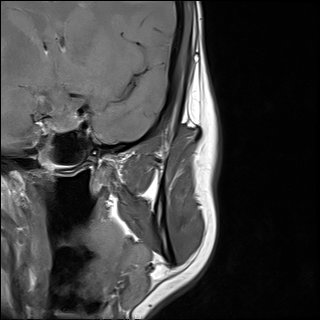

[Series 9: PD · sagittal · 4.0mm · 0.44mm/px · 1 of 11 slices shown (3 of 8)]
[im 1/11]
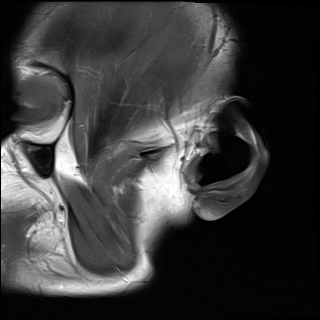

[Series 10: PD · sagittal · 4.0mm · 0.44mm/px · 1 of 9 slices shown (4 of 8)]
[im 1/9]
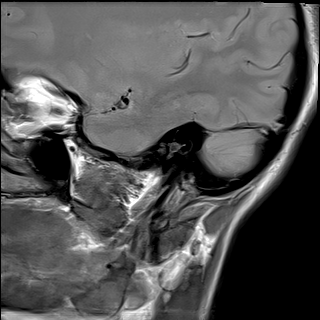

[Series 12: PD · sagittal · 4.0mm · 0.44mm/px · 1 of 11 slices shown (5 of 8)]
[im 1/11]
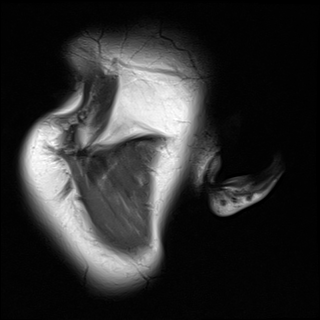

[Series 13: PD · sagittal · 4.0mm · 0.44mm/px · 1 of 11 slices shown (6 of 8)]
[im 1/11]
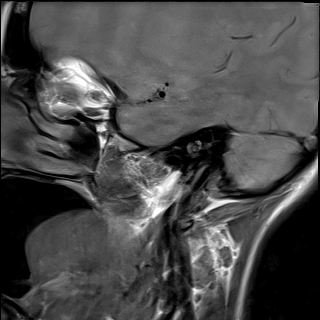

[Series 14: PD · coronal · 4.0mm · 0.44mm/px · 2 of 13 slices shown (7 of 8)]
[im 1/13]
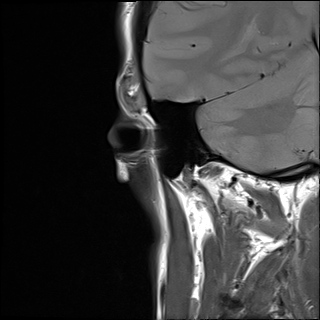
[im 13/13]
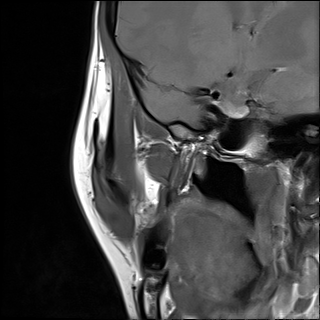

[Series 15: PD · coronal · 4.0mm · 0.44mm/px · 2 of 13 slices shown (8 of 8)]
[im 1/13]
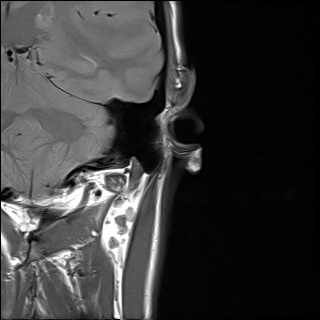
[im 13/13]
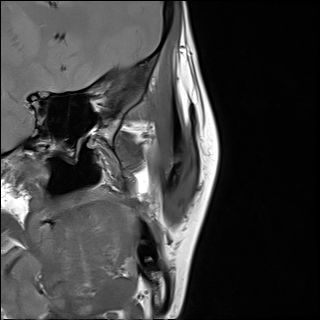

[16 of 16 positions shown; findings below may reference images not displayed]

FINDINGS: Right temporomandibular joint: Degenerated appearance of the
articular disc. Anterior displacement in the closed position.Upon
opening, the disc remains anteriorly displaced with a buckled/folded
appearance (sagittal PD open image 5, series 12).There is normal
anterior translation of the mandibular condyle. There is no joint
effusion. There is mild bony edema within the mandibular condyle,
likely reactive.

Left temporomandibular joint: Degenerative appearance of the
articular disc with likely central perforation. Anterior
displacement of the disc in the closed position. There is recapture
upon opening. There is normal anterior translation of the mandibular
condyle.There is no joint effusion.

Other: None
IMPRESSION: Right TMJ: Degenerated articular disc with anterior disc
displacement in the closed position, and without reduction upon
mouth opening. Bony edema within the mandibular condyle, likely
reactive/related to early degenerative joint disease.

Left TMJ: Degenerated appearance of the articular disc with likely
central perforation. Anterior disc displacement in the closed
position with recapture upon opening.

## 2023-12-08 ENCOUNTER — Encounter: Payer: Self-pay | Admitting: Nurse Practitioner
